# Patient Record
Sex: Male | Born: 1965 | Race: Black or African American | Hispanic: No | Smoking: Current every day smoker
Health system: Southern US, Community
[De-identification: ages and names within clinical notes are randomized; demographics above are authoritative.]

## PROBLEM LIST (undated history)

## (undated) DIAGNOSIS — F319 Bipolar disorder, unspecified: Secondary | ICD-10-CM

## (undated) DIAGNOSIS — E559 Vitamin D deficiency, unspecified: Secondary | ICD-10-CM

## (undated) DIAGNOSIS — I10 Essential (primary) hypertension: Secondary | ICD-10-CM

## (undated) DIAGNOSIS — Z9114 Patient's other noncompliance with medication regimen: Secondary | ICD-10-CM

## (undated) DIAGNOSIS — F209 Schizophrenia, unspecified: Secondary | ICD-10-CM

## (undated) DIAGNOSIS — Z8619 Personal history of other infectious and parasitic diseases: Secondary | ICD-10-CM

## (undated) HISTORY — PX: BELOW KNEE LEG AMPUTATION: SUR23

---

## 2013-05-29 LAB — COMPREHENSIVE METABOLIC PANEL
Alkaline Phosphatase: 82 U/L (ref 50–136)
Anion Gap: 5 — ABNORMAL LOW (ref 7–16)
BUN: 10 mg/dL (ref 7–18)
Bilirubin,Total: 0.5 mg/dL (ref 0.2–1.0)
Calcium, Total: 9.2 mg/dL (ref 8.5–10.1)
Co2: 28 mmol/L (ref 21–32)
EGFR (Non-African Amer.): >60
Osmolality: 279 (ref 275–301)
Potassium: 4 mmol/L (ref 3.5–5.1)
SGPT (ALT): 20 U/L (ref 12–78)

## 2013-05-29 LAB — ETHANOL
Ethanol %: <0.003 % (ref 0.000–0.080)
Ethanol: <3 mg/dL

## 2013-05-29 LAB — TSH: Thyroid Stimulating Horm: 0.37 u[IU]/mL — ABNORMAL LOW

## 2013-05-29 LAB — CBC
MCH: 27.6 pg (ref 26.0–34.0)
MCHC: 33.8 g/dL (ref 32.0–36.0)
Platelet: 238 10*3/uL (ref 150–440)
RBC: 4.95 10*6/uL (ref 4.40–5.90)
RDW: 14 % (ref 11.5–14.5)

## 2013-05-29 LAB — SALICYLATE LEVEL: Salicylates, Serum: 3.1 mg/dL — ABNORMAL HIGH

## 2013-05-30 ENCOUNTER — Inpatient Hospital Stay: Payer: Self-pay | Admitting: Psychiatry

## 2013-05-30 LAB — URINALYSIS, COMPLETE
Bacteria: NONE SEEN
Bacteria: NONE SEEN
Bilirubin,UR: NEGATIVE
Blood: NEGATIVE
Blood: NEGATIVE
Glucose,UR: NEGATIVE mg/dL (ref 0–75)
Glucose,UR: NEGATIVE mg/dL (ref 0–75)
Leukocyte Esterase: NEGATIVE
Leukocyte Esterase: NEGATIVE
Nitrite: NEGATIVE
Ph: 6 (ref 4.5–8.0)
Ph: 5 (ref 4.5–8.0)
Protein: NEGATIVE
Protein: NEGATIVE
RBC,UR: <1 /HPF (ref 0–5)
RBC,UR: 1 /HPF (ref 0–5)
Specific Gravity: 1.016 (ref 1.003–1.030)
Specific Gravity: 1.02 (ref 1.003–1.030)
Squamous Epithelial: <1
Squamous Epithelial: NONE SEEN
WBC UR: <1 /HPF (ref 0–5)
WBC UR: <1 /HPF (ref 0–5)

## 2013-05-30 LAB — DRUG SCREEN, URINE
Cannabinoid 50 Ng, Ur ~~LOC~~: NEGATIVE (ref ?–50)
Methadone, Ur Screen: NEGATIVE (ref ?–300)
Phencyclidine (PCP) Ur S: NEGATIVE (ref ?–25)
Tricyclic, Ur Screen: NEGATIVE (ref ?–1000)

## 2013-05-31 LAB — BEHAVIORAL MEDICINE 1 PANEL
Albumin: 3.2 g/dL — ABNORMAL LOW (ref 3.4–5.0)
Alkaline Phosphatase: 70 U/L (ref 50–136)
Anion Gap: 3 — ABNORMAL LOW (ref 7–16)
BUN: 8 mg/dL (ref 7–18)
Basophil #: 0.1 10*3/uL (ref 0.0–0.1)
Basophil %: 0.8 %
Bilirubin,Total: 0.3 mg/dL (ref 0.2–1.0)
Calcium, Total: 9.2 mg/dL (ref 8.5–10.1)
Chloride: 105 mmol/L (ref 98–107)
Co2: 30 mmol/L (ref 21–32)
Creatinine: 1.04 mg/dL (ref 0.60–1.30)
EGFR (African American): >60
EGFR (Non-African Amer.): >60
Eosinophil #: 0.1 10*3/uL (ref 0.0–0.7)
Eosinophil %: 1.5 %
Glucose: 95 mg/dL (ref 65–99)
HCT: 40.2 % (ref 40.0–52.0)
HGB: 13.2 g/dL (ref 13.0–18.0)
Lymphocyte #: 3.7 10*3/uL — ABNORMAL HIGH (ref 1.0–3.6)
Lymphocyte %: 49 %
MCH: 27.4 pg (ref 26.0–34.0)
MCHC: 32.9 g/dL (ref 32.0–36.0)
MCV: 84 fL (ref 80–100)
Monocyte #: 0.7 x10 3/mm (ref 0.2–1.0)
Monocyte %: 9.4 %
Neutrophil #: 2.9 10*3/uL (ref 1.4–6.5)
Neutrophil %: 39.3 %
Osmolality: 274 (ref 275–301)
Platelet: 234 10*3/uL (ref 150–440)
Potassium: 4.1 mmol/L (ref 3.5–5.1)
RBC: 4.82 10*6/uL (ref 4.40–5.90)
RDW: 14.2 % (ref 11.5–14.5)
SGOT(AST): 23 U/L (ref 15–37)
SGPT (ALT): 15 U/L (ref 12–78)
Sodium: 138 mmol/L (ref 136–145)
Thyroid Stimulating Horm: 0.214 u[IU]/mL — ABNORMAL LOW
Total Protein: 6.7 g/dL (ref 6.4–8.2)
WBC: 7.4 10*3/uL (ref 3.8–10.6)

## 2014-02-09 ENCOUNTER — Emergency Department: Payer: Self-pay | Admitting: Emergency Medicine

## 2014-02-09 LAB — URINALYSIS, COMPLETE
Bacteria: NONE SEEN
Bilirubin,UR: NEGATIVE
Blood: NEGATIVE
Glucose,UR: NEGATIVE mg/dL (ref 0–75)
KETONE: NEGATIVE
Leukocyte Esterase: NEGATIVE
NITRITE: NEGATIVE
Ph: 5 (ref 4.5–8.0)
Protein: NEGATIVE
RBC,UR: NONE SEEN /HPF (ref 0–5)
SQUAMOUS EPITHELIAL: NONE SEEN
Specific Gravity: 1.004 (ref 1.003–1.030)
WBC UR: NONE SEEN /HPF (ref 0–5)

## 2014-02-09 LAB — ETHANOL
Ethanol %: 0.254 % — ABNORMAL HIGH (ref 0.000–0.080)
Ethanol: 254 mg/dL

## 2014-02-09 LAB — ACETAMINOPHEN LEVEL: Acetaminophen: <2 ug/mL

## 2014-02-09 LAB — DRUG SCREEN, URINE
Amphetamines, Ur Screen: NEGATIVE (ref ?–1000)
Barbiturates, Ur Screen: NEGATIVE (ref ?–200)
Benzodiazepine, Ur Scrn: NEGATIVE (ref ?–200)
COCAINE METABOLITE, UR ~~LOC~~: NEGATIVE (ref ?–300)
Cannabinoid 50 Ng, Ur ~~LOC~~: NEGATIVE (ref ?–50)
MDMA (ECSTASY) UR SCREEN: NEGATIVE (ref ?–500)
Methadone, Ur Screen: NEGATIVE (ref ?–300)
Opiate, Ur Screen: NEGATIVE (ref ?–300)
PHENCYCLIDINE (PCP) UR S: NEGATIVE (ref ?–25)
TRICYCLIC, UR SCREEN: NEGATIVE (ref ?–1000)

## 2014-02-09 LAB — COMPREHENSIVE METABOLIC PANEL
Albumin: 3.9 g/dL (ref 3.4–5.0)
Alkaline Phosphatase: 80 U/L
Anion Gap: 7 (ref 7–16)
BILIRUBIN TOTAL: 0.2 mg/dL (ref 0.2–1.0)
BUN: 8 mg/dL (ref 7–18)
Calcium, Total: 9 mg/dL (ref 8.5–10.1)
Chloride: 109 mmol/L — ABNORMAL HIGH (ref 98–107)
Co2: 25 mmol/L (ref 21–32)
Creatinine: 1.08 mg/dL (ref 0.60–1.30)
Glucose: 107 mg/dL — ABNORMAL HIGH (ref 65–99)
OSMOLALITY: 280 (ref 275–301)
POTASSIUM: 3.9 mmol/L (ref 3.5–5.1)
SGOT(AST): 38 U/L — ABNORMAL HIGH (ref 15–37)
SGPT (ALT): 19 U/L (ref 12–78)
Sodium: 141 mmol/L (ref 136–145)
TOTAL PROTEIN: 8 g/dL (ref 6.4–8.2)

## 2014-02-09 LAB — CBC
HCT: 46 % (ref 40.0–52.0)
HGB: 14.8 g/dL (ref 13.0–18.0)
MCH: 28 pg (ref 26.0–34.0)
MCHC: 32.2 g/dL (ref 32.0–36.0)
MCV: 87 fL (ref 80–100)
Platelet: 255 10*3/uL (ref 150–440)
RBC: 5.29 10*6/uL (ref 4.40–5.90)
RDW: 14.6 % — AB (ref 11.5–14.5)
WBC: 8.4 10*3/uL (ref 3.8–10.6)

## 2014-02-09 LAB — SALICYLATE LEVEL: Salicylates, Serum: 2.9 mg/dL — ABNORMAL HIGH

## 2014-04-20 ENCOUNTER — Emergency Department: Payer: Self-pay | Admitting: Emergency Medicine

## 2014-04-20 LAB — COMPREHENSIVE METABOLIC PANEL
ALK PHOS: 70 U/L
Albumin: 3.5 g/dL (ref 3.4–5.0)
Anion Gap: 5 — ABNORMAL LOW (ref 7–16)
BUN: 6 mg/dL — ABNORMAL LOW (ref 7–18)
Bilirubin,Total: 0.3 mg/dL (ref 0.2–1.0)
CO2: 26 mmol/L (ref 21–32)
Calcium, Total: 8.4 mg/dL — ABNORMAL LOW (ref 8.5–10.1)
Chloride: 111 mmol/L — ABNORMAL HIGH (ref 98–107)
Creatinine: 1 mg/dL (ref 0.60–1.30)
EGFR (African American): >60
EGFR (Non-African Amer.): >60
Glucose: 83 mg/dL (ref 65–99)
Osmolality: 280 (ref 275–301)
Potassium: 4 mmol/L (ref 3.5–5.1)
SGOT(AST): 28 U/L (ref 15–37)
SGPT (ALT): 25 U/L
Sodium: 142 mmol/L (ref 136–145)
Total Protein: 7.5 g/dL (ref 6.4–8.2)

## 2014-04-20 LAB — CBC WITH DIFFERENTIAL/PLATELET
Basophil #: 0 10*3/uL (ref 0.0–0.1)
Basophil %: 0.2 %
EOS ABS: 0.1 10*3/uL (ref 0.0–0.7)
Eosinophil %: 0.7 %
HCT: 42 % (ref 40.0–52.0)
HGB: 13.4 g/dL (ref 13.0–18.0)
LYMPHS ABS: 4.5 10*3/uL — AB (ref 1.0–3.6)
Lymphocyte %: 45.4 %
MCH: 27.4 pg (ref 26.0–34.0)
MCHC: 31.9 g/dL — AB (ref 32.0–36.0)
MCV: 86 fL (ref 80–100)
MONO ABS: 0.9 x10 3/mm (ref 0.2–1.0)
MONOS PCT: 8.6 %
Neutrophil #: 4.5 10*3/uL (ref 1.4–6.5)
Neutrophil %: 45.1 %
Platelet: 233 10*3/uL (ref 150–440)
RBC: 4.88 10*6/uL (ref 4.40–5.90)
RDW: 13.5 % (ref 11.5–14.5)
WBC: 9.9 10*3/uL (ref 3.8–10.6)

## 2014-04-20 LAB — TSH: THYROID STIMULATING HORM: 0.569 u[IU]/mL

## 2014-04-20 LAB — SALICYLATE LEVEL: SALICYLATES, SERUM: 3.6 mg/dL — AB

## 2014-04-20 LAB — TROPONIN I

## 2014-04-20 LAB — ETHANOL
ETHANOL %: 0.161 % — AB (ref 0.000–0.080)
ETHANOL LVL: 161 mg/dL

## 2014-04-20 LAB — ACETAMINOPHEN LEVEL

## 2014-04-21 LAB — DRUG SCREEN, URINE
Amphetamines, Ur Screen: NEGATIVE (ref ?–1000)
BARBITURATES, UR SCREEN: NEGATIVE (ref ?–200)
BENZODIAZEPINE, UR SCRN: NEGATIVE (ref ?–200)
COCAINE METABOLITE, UR ~~LOC~~: POSITIVE (ref ?–300)
Cannabinoid 50 Ng, Ur ~~LOC~~: NEGATIVE (ref ?–50)
MDMA (Ecstasy)Ur Screen: NEGATIVE (ref ?–500)
Methadone, Ur Screen: NEGATIVE (ref ?–300)
OPIATE, UR SCREEN: NEGATIVE (ref ?–300)
PHENCYCLIDINE (PCP) UR S: NEGATIVE (ref ?–25)
Tricyclic, Ur Screen: NEGATIVE (ref ?–1000)

## 2014-04-29 ENCOUNTER — Inpatient Hospital Stay: Payer: Self-pay | Admitting: Psychiatry

## 2014-04-29 LAB — URINALYSIS, COMPLETE
BILIRUBIN, UR: NEGATIVE
BLOOD: NEGATIVE
Bacteria: NONE SEEN
Glucose,UR: NEGATIVE mg/dL (ref 0–75)
KETONE: NEGATIVE
LEUKOCYTE ESTERASE: NEGATIVE
NITRITE: NEGATIVE
Ph: 5 (ref 4.5–8.0)
Protein: NEGATIVE
RBC,UR: 1 /HPF (ref 0–5)
Specific Gravity: 1.019 (ref 1.003–1.030)
WBC UR: 1 /HPF (ref 0–5)

## 2014-04-29 LAB — COMPREHENSIVE METABOLIC PANEL
ALBUMIN: 3.5 g/dL (ref 3.4–5.0)
ALK PHOS: 77 U/L
Anion Gap: 10 (ref 7–16)
BUN: 6 mg/dL — ABNORMAL LOW (ref 7–18)
Bilirubin,Total: 0.2 mg/dL (ref 0.2–1.0)
CO2: 25 mmol/L (ref 21–32)
Calcium, Total: 8.9 mg/dL (ref 8.5–10.1)
Chloride: 107 mmol/L (ref 98–107)
Creatinine: 0.98 mg/dL (ref 0.60–1.30)
EGFR (African American): >60
Glucose: 92 mg/dL (ref 65–99)
OSMOLALITY: 280 (ref 275–301)
Potassium: 3.7 mmol/L (ref 3.5–5.1)
SGOT(AST): 33 U/L (ref 15–37)
SGPT (ALT): 22 U/L
Sodium: 142 mmol/L (ref 136–145)
Total Protein: 7.8 g/dL (ref 6.4–8.2)

## 2014-04-29 LAB — DRUG SCREEN, URINE
AMPHETAMINES, UR SCREEN: NEGATIVE (ref ?–1000)
BARBITURATES, UR SCREEN: NEGATIVE (ref ?–200)
Benzodiazepine, Ur Scrn: NEGATIVE (ref ?–200)
Cannabinoid 50 Ng, Ur ~~LOC~~: NEGATIVE (ref ?–50)
Cocaine Metabolite,Ur ~~LOC~~: POSITIVE (ref ?–300)
MDMA (Ecstasy)Ur Screen: NEGATIVE (ref ?–500)
Methadone, Ur Screen: NEGATIVE (ref ?–300)
Opiate, Ur Screen: NEGATIVE (ref ?–300)
Phencyclidine (PCP) Ur S: NEGATIVE (ref ?–25)
Tricyclic, Ur Screen: NEGATIVE (ref ?–1000)

## 2014-04-29 LAB — CBC
HCT: 43.8 % (ref 40.0–52.0)
HGB: 14.3 g/dL (ref 13.0–18.0)
MCH: 27.8 pg (ref 26.0–34.0)
MCHC: 32.6 g/dL (ref 32.0–36.0)
MCV: 85 fL (ref 80–100)
PLATELETS: 215 10*3/uL (ref 150–440)
RBC: 5.14 10*6/uL (ref 4.40–5.90)
RDW: 14 % (ref 11.5–14.5)
WBC: 9.5 10*3/uL (ref 3.8–10.6)

## 2014-04-29 LAB — SALICYLATE LEVEL: Salicylates, Serum: 4.1 mg/dL — ABNORMAL HIGH

## 2014-04-29 LAB — ETHANOL
ETHANOL LVL: 31 mg/dL
Ethanol %: 0.031 % (ref 0.000–0.080)

## 2014-04-29 LAB — ACETAMINOPHEN LEVEL

## 2015-01-11 NOTE — H&P (Signed)
PATIENT NAME:  Sean Hoffman, Sean Hoffman MR#:  [id number] DATE OF BIRTH:  1966-06-29  DATE OF ADMISSION:  05/30/2013  DATE OF ASSESSMENT: 05/31/2013    REFERRING PHYSICIAN: Dr. York Cerise.  ATTENDING PHYSICIAN: Jolanta B. Jennet Maduro, M.D.   IDENTIFYING DATA: Sean Hoffman is a 49 year old man with history of depression and cocaine addiction.   CHIEF COMPLAINT: "I'm a failure."   HISTORY OF PRESENT ILLNESS: Sean Hoffman has been a patient of Dr. Marguerite Olea. He has been compliant with his medications of Celexa. He says that the medication was working very well for him. Two weeks ago, his girlfriend was admitted to the hospital for an ulcer. It is hard to understand what happened, but the patient felt overwhelmed, stopped taking his Celexa and relapsed on cocaine. He has been 8 years sober following treatment at the [facility] rehab facility. He tells me that he is not really glad to be alive. He feels awful and has a feeling that he cannot accomplish anything in his life; that everybody else would be better off if he were dead. He reports poor sleep, decreased appetite, anhedonia, feeling of guilt, hopelessness,  worthlessness, poor energy and concentration, social isolation, crying spells that culminated in a suicide attempt by overdose of 17 40 mg tablets of Celexa. He also noticed auditory hallucinations with voices commanding him to hurt himself. He reports no psychotic symptoms in the past except for periods of substance abuse. He denies symptoms suggestive of bipolar mania. There are no other than cocaine substances involved.   PAST PSYCHIATRIC HISTORY: He had a suicide attempt by hanging in 2000. At that time, he was going through a divorce with his wife. He was hospitalized in [facility]. He later entered [facility] treatment facility where he spent 30 days in rehab and about 10 months in their substance abuse treatment program. He was clean of cocaine up until now.   FAMILY PSYCHIATRIC HISTORY: None  reported.   PAST MEDICAL HISTORY: None.   ALLERGIES: No known drug allergies.   MEDICATIONS ON ADMISSION: Celexa 20 mg daily.   SOCIAL HISTORY: He is originally from [place], [place]. Graduated from high school and had some college. He used to work at [employer] up until 2 years ago where he started his disability. He reports that disability is for mental illness. He is divorced and has 2 children. His daughter is in college. His 36 year old son lives in the area and he can see him as much as he wants. They have a good relationship. He has a remote history of DUI and charges of possession of stolen  merchandise. There are no legal charges pending at this point. His chart says that he is homeless now, but he tells me that he stays with his girlfriend and they rent a house.   REVIEW OF SYSTEMS: CONSTITUTIONAL: No fevers or chills. No weight changes.  EYES: No double or blurred vision.  EARS, NOSE, THROAT: No hearing loss.  RESPIRATORY: No shortness of breath or cough.  CARDIOVASCULAR: No chest pain or orthopnea.  GASTROINTESTINAL: No abdominal pain, nausea, vomiting or diarrhea.  GENITOURINARY: No incontinence or frequency.  ENDOCRINE: No heat or cold intolerance.  LYMPHATIC: No anemia or easy bruising.  INTEGUMENTARY: No acne or rash.  MUSCULOSKELETAL: No muscle or joint pain.  NEUROLOGIC: No tingling or weakness.  PSYCHIATRIC: See history of present illness for details.   PHYSICAL EXAMINATION: VITAL SIGNS: Blood pressure 116/82, pulse 63, respirations 18, temperature 97.7.  GENERAL: This is a well-developed male in no acute  distress.  HEENT: The pupils are equal, round and reactive to light. Sclerae are anicteric.  NECK: Supple. No thyromegaly.  LUNGS: Clear to auscultation. No dullness to percussion.  HEART: Regular rhythm and rate. No murmurs, rubs or gallops.  ABDOMEN: Soft, nontender, nondistended. Positive bowel sounds.  MUSCULOSKELETAL: Normal muscle strength in all extremities.   SKIN: No rashes or bruises.  LYMPHATIC: No cervical adenopathy.  NEUROLOGIC: Cranial nerves II through XII are intact.   LABORATORY DATA: Chemistries are within normal limits. Blood alcohol level is 0. LFTs within normal limits. TSH 0.37. Urine tox screen is positive for cocaine. CBC within normal limits. Urinalysis is not suggestive of urinary tract infection. Serum acetaminophen less than 2, serum salicylates 3.1.   MENTAL STATUS EXAMINATION ON ADMISSION: The patient is alert and oriented to person, place, time and situation. He is pleasant, polite and cooperative. He is in bed, wearing hospital scrubs. There is psychomotor retardation. He maintains limited eye contact. His speech is soft. Mood is depressed with flat affect. Thought process is logical and goal oriented. Thought content: He denies suicidal or homicidal ideation, but is quite unhappy to still be alive after a suicide attempt. He is able to contract for safety in the hospital. There are no delusions or paranoia. He endorses auditory command hallucinations telling him to kill himself. His cognition is grossly intact. He registers 3/3 recalls 3/3 objects after 5 minutes. He can spell "world" forwards and backwards. He knows the current president. His insight and judgment are questionable.   SUICIDE RISK ASSESSMENT ON ADMISSION: This is a patient with history of depression who attempted suicide in the context of relationship problems and relapse on cocaine. He is at increased risk of suicide.   INITIAL DIAGNOSES:  AXIS I: Major depressive disorder, recurrent, severe. Cocaine dependence.  AXIS II: Deferred.  AXIS III: Deferred.  AXIS IV: Mental illness, substance abuse, relationship, housing, primary support.  AXIS V: Global Assessment of Functioning on admission 25.   PLAN: The patient was admitted to [facility] behavioral medicine unit for safety, stabilization and medication management. He was initially placed  on suicide precautions and was closely monitored for any unsafe behaviors. He underwent full psychiatric and risk assessment. He will receive pharmacotherapy, individual and group psychotherapy, substance abuse counseling and support from therapeutic milieu.  1.  Suicidal ideation: This has resolved, but the patient is still passively suicidal.  2.  Mood: He was started on Celexa by admitting psychiatrist in the Emergency Room. We will unlikely add an antipsychotic to address hallucinations.  3.  Substance abuse: The patient would very much like to go back to [facility] program. A social worker will evaluate.   DISPOSITION: To be established.     ____________________________ Ellin GoodieJolanta B. Pucilowska, MD jbp:dmm D: 05/31/2013 11:46:00 ET T: 05/31/2013 12:55:41 ET JOB#: [id number]  cc: Jolanta B. Jennet MaduroPucilowska, MD, <Dictator> Shari ProwsJOLANTA B PUCILOWSKA MD ELECTRONICALLY SIGNED 06/05/2013 22:20

## 2015-01-11 NOTE — Consult Note (Signed)
PATIENT NAME:  Sean Hoffman, Amadou MR#:  [id number] DATE OF BIRTH:  02-24-1966  DATE OF CONSULTATION:  05/29/2013  CONSULTING PHYSICIAN: Rhunette CroftUzma S Bodee Lafoe, M.D., M.D.   REQUESTING PHYSICIAN: Dr. York CeriseForbach.  REASON FOR CONSULTATION: " I'm a failure."   HISTORY OF PRESENT ILLNESS: The patient is a 49 year old African American male who presented to the ED after he had taken an overdose of 17 Celexa 40 mg tablets. According to the initial notes, the patient reported that he is a failure and life is not worth living. He reported that he took all the pills which were left in the bottle. He stated that he has nobody and he feels alone and he feels very depressed. The patient reported that the Celexa were prescribed by Dr. Marguerite OleaMoffett, and he followed with Dr. Marguerite OleaMoffett last month.   During my interview, the patient appeared very depressed and he reported that he stopped taking the medications approximately 2 weeks ago. He felt that he was going downhill. He feels hopeless, helpless, as he does not have any friends and family. He reported that he also stopped seeing his girlfriend 2 days ago as he does not care about her anymore. He does not want to take the medications. The patient reported that he started using cocaine and spent approximately $300 on it recently. The patient reported that he was borrowing alcohol from the friends. His last use was on Friday. He reported that he relapsed on the drugs and it was bad and he was feeling very depressed and hopeless. He decided to end it all and then took remaining pills of Celexa. He was brought to the hospital by the EMS as he was feeling suicidal. He stated that he wants help at this time.   PAST PSYCHIATRIC HISTORY: The patient reported that he was admitted to a psychiatric hospital in [place] after he tried to hang himself. He has also tried trazodone, Wellbutrin in the past. He reported that occasionally he has command auditory hallucinations while he is intoxicated, who are  asking him to hurt himself. However, he has not heard any such voices in a long time.   FAMILY HISTORY: The patient denies any history of psychiatric illness in his family. He denies any history of suicide attempts in the family members as well.  MEDICAL HISTORY: The patient reported that he does not have any medical issues at this time.   ALLERGIES: No known drug allergies.   SOCIAL HISTORY: The patient was married once in the past. He has two children, ages 6023 and 49 years old. They currently live with their mother. He reported that he receives a check from [employer]. He was last employed 2 years ago when he working for [employer]. He reported that he has history of DWI in 2007 and was also charged with possession of stolen merchandise in 2006. He does not have any pending legal charges at this time. He reported that he does not have a place to stay at this time.   CURRENT MEDICATIONS:  Citalopram 20 mg daily prescribed by Dr. Marguerite OleaMoffett. He is compliant with his medications, but has stopped taking them for the past two weeks.   PHYSICAL EXAMINATION:  VITAL SIGNS: Temperature 98.4, pulse 68, respirations 18, blood pressure 158/85, Glucose 112, BUN 10, creatinine 0.95, sodium 140, potassium 4.0, chloride 107, bicarbonate 28, anion gap 5, osmolality 279, calcium 9.2, blood alcohol level less than 3. Protein 7.0, albumin 3.4, bilirubin 0.5, alkaline phosphatase 82, AST 29, ALT 20. TSH 0.37. WBC 8.3,  RBC 4.95, hemoglobin 13.7, hematocrit 40.4, MCV 82.   REVIEW OF SYSTEMS:  CONSTITUTIONAL: Denies any fever or chills. No weight changes.  EYES: No double or blurred vision.  RESPIRATORY: No shortness of breath or cough.  CARDIOVASCULAR: Denies any chest pain or orthopnea.  GASTROINTESTINAL: No abdominal pain, nausea, vomiting or diarrhea.  GENITOURINARY: No incontinence or frequency.  ENDOCRINE: No heat or cold intolerance.  LYMPHATIC: No anemia or easy bruising.  INTEGUMENTARY: No acne or rash.   MUSCULOSKELETAL: No muscle or joint pain.  NEUROLOGIC: No tingling or weakness.   MENTAL STATUS EXAMINATION: The patient is a moderately built male who appeared his stated age. He was calm and cooperative. He was lying in the bed. His speech was low in tone and volume. Mood was depressed and anxious. Affect was congruent. Thought process was logical, goal-directed. Thought content was non delusional. He was unable to contract for safety and recently attempted suicide by overdosing on the pills. He denied having any perceptual disturbances.   DIAGNOSTIC IMPRESSION: AXIS I:  1. Major depressive disorder, recurrent, severe, without psychotic features.  2. Alcohol dependence.  3. Cocaine abuse.  AXIS II: None.  AXIS III: None.   TREATMENT PLAN: 1. The patient is currently under involuntary commitment and will be admitted to the behavioral health unit for stabilization and safety.  2.  He will be continued on Celexa 20  mg p.o. daily.  3. He will also be given trazodone 100 mg p.o. at bedtime for insomnia. The patient will be monitored by the treatment team and they will adjust his medications according to his needs.   Thank you for allowing me to participate in the care of this patient.   ____________________________ Ardeen Fillers. Garnetta Buddy, MD usf:sg D: 05/30/2013 11:56:00 ET T: 05/30/2013 12:55:30 ET JOB#: [id number]  cc: Ardeen Fillers. Garnetta Buddy, MD, <Dictator> Rhunette Croft MD ELECTRONICALLY SIGNED 06/01/2013 13:17

## 2015-01-12 NOTE — H&P (Signed)
PATIENT NAME:  Sean Hoffman, LONGSHORE MR#:  [id number] DATE OF BIRTH:  03-18-1966  DATE OF ADMISSION:  04/29/2014  REFERRING PHYSICIAN: Emergency Room MD.   ATTENDING PHYSICIAN: Kristine Linea, MD   IDENTIFYING DATA: [name] is a 49 year old male with history of depression and substance abuse.   CHIEF COMPLAINT: "I went on a binge."   HISTORY OF PRESENT ILLNESS: [name] came to the Emergency Room suicidal and very depressed. He relapsed on alcohol and cocaine 3 weeks ago and has been drinking continuously since. He feels in bad, guilty, worthless, hopeless, and decided to kill life with overdose of pills. Somehow, his family became aware and they encouraged him to come to the hospital. The patient has a long history of depression, drinking, and drugging. He has had several psychiatric hospitalizations for depression and also for substance abuse. He has been following up with Dr. Carman Ching at [facility] and when not drinking he is taking Celexa and trazodone. However, he stopped all his medications 3 weeks ago. He reports poor sleep, decreased appetite, anhedonia, feeling of guilt, hopelessness, worthlessness, poor memory and concentration, crying spells, social isolation that culminated in suicidal thoughts and plan. He denies psychotic symptoms, denies symptoms suggestive of bipolar mania. There is no severe anxiety. He had admits to heavy drinking, cannot specify how much, and using cocaine and marijuana.   PAST PSYCHIATRIC HISTORY: He has been hospitalized several times for substance abuse and for depression. He was at [facility] in September 2014. From here, he went to [facility] and was able to maintain sobriety for 4 months. He has one prior admission to [facility] when he stayed for a month and 10 more months in the substance abuse long-term treatment program. He was clean, off substances for over 10 years.   FAMILY PSYCHIATRIC HISTORY: None reported.   PAST MEDICAL  HISTORY: None.   ALLERGIES: No known drug allergies.   MEDICATIONS ON ADMISSION: Celexa 30 mg daily, trazodone 150 mg at bedtime.   SOCIAL HISTORY: He is originally from the [place]. He graduated from high school and went to college some. He worked at the [employer], but is disabled now. Disability is reportedly for mental illness. He is divorced. He has 2 children in the area with whom he stays in touch. There are no legal charges at this point. He lives independently. His sister lives in the area and has been very supportive. He was in a relationship, but tells me that now he feels that substance abuse treatment is more important, so the relationship is on hold.   REVIEW OF SYSTEMS: CONSTITUTIONAL: No fevers or chills. No weight changes.  EYES: No double or blurred vision.  EARS, NOSE, AND THROAT: No hearing loss.  RESPIRATORY: No shortness of breath or cough.  CARDIOVASCULAR: No chest pain or orthopnea.  GASTROINTESTINAL: No abdominal pain, nausea, vomiting, or diarrhea.  GENITOURINARY: No incontinence or frequency.  ENDOCRINE: No heat or cold intolerance.  LYMPHATIC: No anemia or easy bruising.  INTEGUMENTARY: No acne or rash.  MUSCULOSKELETAL: No muscle or joint pain.  NEUROLOGIC: No tingling or weakness.  PSYCHIATRIC: See history of present illness for details.   PHYSICAL EXAMINATION:  VITAL SIGNS: Blood pressure 121/88, pulse 66, respirations 20, temperature 98.6.  GENERAL: This is a well-developed, middle-aged gentleman in no acute distress.  HEENT: The pupils are equal, round, and reactive to light. Sclerae are anicteric.  NECK: Supple. No thyromegaly.  LUNGS: Clear to auscultation. No dullness to percussion.  HEART:  Regular rhythm and rate. No murmurs, rubs, or gallops.  ABDOMEN: Soft, nontender, nondistended. Positive bowel sounds.  MUSCULOSKELETAL: Normal muscle strength in all extremities.  SKIN: No rashes or bruises.  LYMPHATIC: No cervical adenopathy.  NEUROLOGIC:  Cranial nerves II-XII are intact.   LABORATORY DATA: Chemistries are within normal limits. Blood alcohol level is 0.031. LFTs within normal limits. Urine toxicology screen is positive for cocaine. CBC within normal limits. Urinalysis is not suggestive of urinary tract infection. Serum acetaminophen and salicylates are low.   MENTAL STATUS EXAMINATION ON ADMISSION: The patient is alert and oriented to person, place, time, and situation. He is pleasant, polite, and cooperative. He recognizes me from previous admission. He maintains good eye contact. His speech is of normal rhythm, rate, and volume. Mood is depressed with flat affect. Thought process is logical and goal oriented. Thought content: He denies suicidal or homicidal ideation, but tried to attempt suicide by medication overdose prior to admission. There are no delusions or paranoia. There are no auditory or visual hallucinations. His cognition is grossly intact. Registration, recall, short and long-term memory are intact. He is of average intelligence and fund of knowledge. His insight and judgment are limited.   SUICIDE RISK ASSESSMENT ON ADMISSION: This is a patient with a long history of mental illness and disability from mental illness. He is in great need of substance abuse treatment and is willing to try intensive outpatient program.   INITIAL DIAGNOSES: AXIS I: Major depressive disorder, recurrent, severe; alcohol dependence; cocaine dependence.  AXIS II: Deferred.  AXIS III: Deferred.  AXIS IV: Mental illness; substance abuse. AXIS V: Global assessment of functions on admission 25.   PLAN: The patient was admitted to [facility] Behavioral Medicine Unit for safety, stabilization, and medication management. He was initially placed on suicide precautions and was closely monitored for any unsafe behaviors. He underwent full psychiatric and risk assessment. He received pharmacotherapy, individual and group  psychotherapy, substance abuse counseling, and support from therapeutic milieu.  1.  Suicidal ideation: The patient is able to contract for safety.  2.  Alcohol detoxification: He is placed on the CIWA protocol. We will monitor for symptoms of alcohol withdrawal. 3.  Depression. We started Celexa and trazodone.  4.  Substance abuse treatment: He opted for outpatient treatment.   DISPOSITION: He will be discharged to home. Follow up with [facility].    ___________________________ Ellin GoodieJolanta B. Jennet MaduroPucilowska, MD jbp:tm D: 04/30/2014 13:54:10 ET T: 04/30/2014 14:38:52 ET JOB#: [id number]  cc: Jolanta B. Jennet MaduroPucilowska, MD, <Dictator> Shari ProwsJOLANTA B PUCILOWSKA MD ELECTRONICALLY SIGNED 05/05/2014 2:32

## 2016-08-04 ENCOUNTER — Emergency Department
Admission: EM | Admit: 2016-08-04 | Discharge: 2016-08-04 | Disposition: A | Discharge status: Other Acute Inpatient Hospital | Payer: Medicaid Other | Attending: Emergency Medicine | Admitting: Emergency Medicine

## 2016-08-04 ENCOUNTER — Inpatient Hospital Stay
Admission: EM | Admit: 2016-08-04 | Discharge: 2016-08-06 | DRG: 885 | Disposition: A | Discharge status: Home / Self Care | Payer: Medicare Other | Source: Intra-hospital | Attending: Psychiatry | Admitting: Psychiatry

## 2016-08-04 DIAGNOSIS — Z5181 Encounter for therapeutic drug level monitoring: Secondary | ICD-10-CM | POA: Insufficient documentation

## 2016-08-04 DIAGNOSIS — F17200 Nicotine dependence, unspecified, uncomplicated: Secondary | ICD-10-CM

## 2016-08-04 DIAGNOSIS — F332 Major depressive disorder, recurrent severe without psychotic features: Secondary | ICD-10-CM

## 2016-08-04 DIAGNOSIS — I10 Essential (primary) hypertension: Secondary | ICD-10-CM | POA: Insufficient documentation

## 2016-08-04 DIAGNOSIS — F1020 Alcohol dependence, uncomplicated: Secondary | ICD-10-CM

## 2016-08-04 DIAGNOSIS — F17210 Nicotine dependence, cigarettes, uncomplicated: Secondary | ICD-10-CM | POA: Insufficient documentation

## 2016-08-04 DIAGNOSIS — Z89611 Acquired absence of right leg above knee: Secondary | ICD-10-CM

## 2016-08-04 DIAGNOSIS — G4700 Insomnia, unspecified: Secondary | ICD-10-CM | POA: Diagnosis present

## 2016-08-04 DIAGNOSIS — Y907 Blood alcohol level of 200-239 mg/100 ml: Secondary | ICD-10-CM | POA: Diagnosis present

## 2016-08-04 DIAGNOSIS — R45851 Suicidal ideations: Secondary | ICD-10-CM | POA: Diagnosis present

## 2016-08-04 DIAGNOSIS — F1420 Cocaine dependence, uncomplicated: Secondary | ICD-10-CM | POA: Diagnosis present

## 2016-08-04 LAB — URINE DRUG SCREEN, QUALITATIVE (ARMC ONLY)
AMPHETAMINES, UR SCREEN: NOT DETECTED
BENZODIAZEPINE, UR SCRN: NOT DETECTED
Barbiturates, Ur Screen: NOT DETECTED
COCAINE METABOLITE, UR ~~LOC~~: POSITIVE — AB
Cannabinoid 50 Ng, Ur ~~LOC~~: NOT DETECTED
MDMA (ECSTASY) UR SCREEN: NOT DETECTED
METHADONE SCREEN, URINE: NOT DETECTED
OPIATE, UR SCREEN: NOT DETECTED
Phencyclidine (PCP) Ur S: NOT DETECTED
Tricyclic, Ur Screen: NOT DETECTED

## 2016-08-04 LAB — CBC
HEMATOCRIT: 45.8 % (ref 40.0–52.0)
Hemoglobin: 15.1 g/dL (ref 13.0–18.0)
MCH: 27.4 pg (ref 26.0–34.0)
MCHC: 33.1 g/dL (ref 32.0–36.0)
MCV: 83 fL (ref 80.0–100.0)
Platelets: 279 10*3/uL (ref 150–440)
RBC: 5.52 MIL/uL (ref 4.40–5.90)
RDW: 15.2 % — ABNORMAL HIGH (ref 11.5–14.5)
WBC: 10.8 10*3/uL — AB (ref 3.8–10.6)

## 2016-08-04 LAB — BASIC METABOLIC PANEL
ANION GAP: 9 (ref 5–15)
BUN: 8 mg/dL (ref 6–20)
CHLORIDE: 109 mmol/L (ref 101–111)
CO2: 25 mmol/L (ref 22–32)
Calcium: 9.1 mg/dL (ref 8.9–10.3)
Creatinine, Ser: 0.92 mg/dL (ref 0.61–1.24)
GFR calc Af Amer: >60 mL/min (ref >60)
GFR calc non Af Amer: >60 mL/min (ref >60)
GLUCOSE: 97 mg/dL (ref 65–99)
POTASSIUM: 3.9 mmol/L (ref 3.5–5.1)
Sodium: 143 mmol/L (ref 135–145)

## 2016-08-04 LAB — ETHANOL: Alcohol, Ethyl (B): 210 mg/dL — ABNORMAL HIGH (ref <5)

## 2016-08-04 MED ORDER — HALOPERIDOL 2 MG PO TABS
2.0000 mg | ORAL_TABLET | Freq: Once | ORAL | Status: AC
  Administered 2016-08-04: 2 mg via ORAL
  Filled 2016-08-04: qty 1

## 2016-08-04 MED ORDER — CITALOPRAM HYDROBROMIDE 20 MG PO TABS
20.0000 mg | ORAL_TABLET | Freq: Every day | ORAL | Status: DC
  Administered 2016-08-04: 20 mg via ORAL
  Filled 2016-08-04: qty 1

## 2016-08-04 MED ORDER — NICOTINE 21 MG/24HR TD PT24
21.0000 mg | MEDICATED_PATCH | Freq: Every day | TRANSDERMAL | Status: DC
  Administered 2016-08-05 – 2016-08-06 (×2): 21 mg via TRANSDERMAL
  Filled 2016-08-04 (×2): qty 1

## 2016-08-04 MED ORDER — PNEUMOCOCCAL VAC POLYVALENT 25 MCG/0.5ML IJ INJ: 0.5000 mL | INJECTION | INTRAMUSCULAR | Status: DC

## 2016-08-04 MED ORDER — TRAZODONE HCL 100 MG PO TABS
100.0000 mg | ORAL_TABLET | Freq: Every evening | ORAL | Status: DC | PRN
  Administered 2016-08-05: 100 mg via ORAL
  Filled 2016-08-04: qty 1

## 2016-08-04 MED ORDER — LORAZEPAM 2 MG/ML IJ SOLN: 0.0000 mg | Freq: Four times a day (QID) | INTRAMUSCULAR | Status: DC

## 2016-08-04 MED ORDER — ALUM & MAG HYDROXIDE-SIMETH 200-200-20 MG/5ML PO SUSP: 30.0000 mL | ORAL | Status: DC | PRN

## 2016-08-04 MED ORDER — LORAZEPAM 2 MG/ML IJ SOLN: 0.0000 mg | Freq: Two times a day (BID) | INTRAMUSCULAR | Status: DC

## 2016-08-04 MED ORDER — INFLUENZA VAC SPLIT QUAD 0.5 ML IM SUSY: 0.5000 mL | PREFILLED_SYRINGE | INTRAMUSCULAR | Status: DC

## 2016-08-04 MED ORDER — LORAZEPAM 1 MG PO TABS
1.0000 mg | ORAL_TABLET | Freq: Once | ORAL | Status: AC
  Administered 2016-08-04: 1 mg via ORAL
  Filled 2016-08-04: qty 1

## 2016-08-04 MED ORDER — CITALOPRAM HYDROBROMIDE 20 MG PO TABS
20.0000 mg | ORAL_TABLET | Freq: Every day | ORAL | Status: DC
  Administered 2016-08-05 – 2016-08-06 (×2): 20 mg via ORAL
  Filled 2016-08-04 (×2): qty 1

## 2016-08-04 MED ORDER — PHENYLEPH-SHARK LIV OIL-MO-PET 0.25-3-14-71.9 % RE OINT
TOPICAL_OINTMENT | Freq: Two times a day (BID) | RECTAL | Status: DC | PRN
  Administered 2016-08-05: via RECTAL
  Filled 2016-08-04 (×2): qty 28.4

## 2016-08-04 MED ORDER — ACETAMINOPHEN 325 MG PO TABS: 650.0000 mg | ORAL_TABLET | Freq: Four times a day (QID) | ORAL | Status: DC | PRN

## 2016-08-04 MED ORDER — MAGNESIUM HYDROXIDE 400 MG/5ML PO SUSP: 30.0000 mL | Freq: Every day | ORAL | Status: DC | PRN

## 2016-08-04 NOTE — ED Triage Notes (Addendum)
Pt arrives via [facility] with reports of not wanting to live anymore  Pt presents a danger to himself  IVC

## 2016-08-04 NOTE — ED Notes (Signed)
BEHAVIORAL HEALTH ROUNDING Patient sleeping: Yes.   Patient alert and oriented: eyes closed  Appears to be asleep Behavior appropriate: Yes.  ; If no, describe:  Nutrition and fluids offered: sleeping Toileting and hygiene offered: sleeping Sitter present: q 15 minute observations and security monitoring Law enforcement present: yes  ODS 
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
## 2016-08-04 NOTE — Tx Team (Signed)
Initial Treatment Plan 08/04/2016 11:02 PM Sean Hoffman ZOX:[id number]    PATIENT STRESSORS: Medication change or noncompliance Substance abuse Other: "Living arrangements"   PATIENT STRENGTHS: Active sense of humor Capable of independent living General fund of knowledge   PATIENT IDENTIFIED PROBLEMS: Depression  Suicidal Ideation "I was hoping to go to sleep and not wake up."  Substance abuse "If I drink I'm just gonna drink till I pass out."    "Depression. Trying to get it under control."  "Getting my meds back so I can get myself back to normal."           DISCHARGE CRITERIA:  Improved stabilization in mood, thinking, and/or behavior Motivation to continue treatment in a less acute level of care Need for constant or close observation no longer present Reduction of life-threatening or endangering symptoms to within safe limits Verbal commitment to aftercare and medication compliance  PRELIMINARY DISCHARGE PLAN: Attend PHP/IOP Attend 12-step recovery group Outpatient therapy  PATIENT/FAMILY INVOLVEMENT: This treatment plan has been presented to and reviewed with the patient, Sean Hoffman, and/or family member.  The patient and family have been given the opportunity to ask questions and make suggestions.  [name], RN 08/04/2016, 11:02 PM

## 2016-08-04 NOTE — ED Notes (Signed)
Dr.clapaas in room with patient. 

## 2016-08-04 NOTE — ED Notes (Signed)

## 2016-08-04 NOTE — ED Notes (Signed)
BEHAVIORAL HEALTH ROUNDING Patient sleeping: No. Patient alert and oriented: yes Behavior appropriate: Yes.  ; If no, describe:  Nutrition and fluids offered: yes Toileting and hygiene offered: Yes  Sitter present: q15 minute observations and security  monitoring Law enforcement present: Yes  ODS  

## 2016-08-04 NOTE — Consult Note (Signed)
[facility] Consult   Reason for Consult:  Consult for 50 year old man who presented to the emergency room with suicidal ideation and substance abuse Referring Physician:  Quale Patient Identification: Sean Hoffman MRN:  [id number] Principal Diagnosis: Severe recurrent major depression without psychotic features [facility]) Diagnosis:   Patient Active Problem List   Diagnosis Date Noted  . Alcohol abuse [F10.10] 08/04/2016  . Cocaine abuse [F14.10] 08/04/2016  . Severe recurrent major depression without psychotic features ([place]) [F33.2] 08/04/2016  . Substance induced mood disorder ([place]) [F19.94] 08/04/2016  . Suicidal ideation [R45.851] 08/04/2016    Total Time spent with patient: 1 hour  Subjective:   Sean Hoffman is a 50 y.o. male patient admitted with "I need help".  HPI:  Patient interviewed. Chart reviewed. Labs and vitals reviewed. 50 year old man presented to the emergency room stating that he felt like killing himself. He says it's been a bad few days. His mood feels very sad and down. It's the anniversary of his mother's death. On top of that he has been drinking a lot and using drugs. Patient came in to the emergency room saying that he thought he had seizures in the last day although the description is a little vague. He says he's had suicidal thoughts without a specific plan. Poor sleep. Poor appetite poor self-care not currently getting any outpatient psychiatric treatment.  Social history: Patient says he has a place to live but it was hard to get any more details out of him. He is very tired and is not a very good historian.  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 No acute complaint from it. No other active medical problems as he is not on any medication.  Substance abuse history: Extensive history of alcohol and drug abuse. Has been to the state program in the past and has had months of  sobriety. Patient says he has had seizures recently. He says he has had hospitalizations for alcohol in the past. It's unclear whether he's had full delirium tremens but he does not really describe it.  Past Psychiatric History: Patient has had previous hospitalizations here. Last time about 3 years ago from what I can tell. Was treated with antidepressants and was treated for substance abuse with detox and referral to outpatient treatment. Patient claims that he has had a suicide attempt in the past although he doesn't offer any more details about it.  Risk to Self: Suicidal Ideation: Yes-Currently Present Suicidal Intent: Yes-Currently Present Is patient at risk for suicide?: Yes Suicidal Plan?: Yes-Currently Present Specify Current Suicidal Plan: Overdosing on medications Access to Means: Yes Specify Access to Suicidal Means: Medications What has been your use of drugs/alcohol within the last 12 months?: Cocaine & Alcohol How many times?: 4 Other Self Harm Risks: Active Addiction Triggers for Past Attempts: None known Intentional Self Injurious Behavior: None Risk to Others: Homicidal Ideation: No Thoughts of Harm to Others: No Current Homicidal Intent: No Current Homicidal Plan: No Access to Homicidal Means: No Identified Victim: Reports of none History of harm to others?: No Assessment of Violence: None Noted Violent Behavior Description: Reports of none Does patient have access to weapons?: No Criminal Charges Pending?: No Does patient have a court date: No Prior Inpatient Therapy: Prior Inpatient Therapy: Yes Prior Therapy Dates: 04/2014, 05/2013 and unable to remember the other dates Prior Therapy Facilty/Provider(s): [place] Reason for Treatment: Depression and substance use Prior Outpatient Therapy: Prior  Outpatient Therapy: Yes Prior Therapy Dates: [facility] Prior Therapy Facilty/Provider(s): 2015 Reason for Treatment: Depression and substance use Does patient  have an ACCT team?: No Does patient have Intensive In-House Services?  : No Does patient have [facility] services? : No Does patient have P4CC services?: No  Past Medical History: No past medical history on file. No past surgical history on file. Family History: No family history on file. Family Psychiatric  History: No information given Social History:  History  Alcohol Use  . Yes     History  Drug Use  . Types: Cocaine, Marijuana    Social History   Social History  . Marital status: Legally Separated    Spouse name: N/A  . Number of children: N/A  . Years of education: N/A   Social History Main Topics  . Smoking status: Current Every Day Smoker  . Smokeless tobacco: Not on file  . Alcohol use Yes  . Drug use:     Types: Cocaine, Marijuana  . Sexual activity: Not on file   Other Topics Concern  . Not on file   Social History Narrative  . No narrative on file   Additional Social History:    Allergies:  No Known Allergies  Labs:  Results for orders placed or performed during the hospital encounter of 08/04/16 (from the past 48 hour(s))  CBC     Status: Abnormal   Collection Time: 08/04/16  7:16 AM  Result Value Ref Range   WBC 10.8 (H) 3.8 - 10.6 K/uL   RBC 5.52 4.40 - 5.90 MIL/uL   Hemoglobin 15.1 13.0 - 18.0 g/dL   HCT 45.8 40.0 - 52.0 %   MCV 83.0 80.0 - 100.0 fL   MCH 27.4 26.0 - 34.0 pg   MCHC 33.1 32.0 - 36.0 g/dL   RDW 15.2 (H) 11.5 - 14.5 %   Platelets 279 150 - 440 K/uL  Basic metabolic panel     Status: None   Collection Time: 08/04/16  7:16 AM  Result Value Ref Range   Sodium 143 135 - 145 mmol/L   Potassium 3.9 3.5 - 5.1 mmol/L   Chloride 109 101 - 111 mmol/L   CO2 25 22 - 32 mmol/L   Glucose, Bld 97 65 - 99 mg/dL   BUN 8 6 - 20 mg/dL   Creatinine, Ser 0.92 0.61 - 1.24 mg/dL   Calcium 9.1 8.9 - 10.3 mg/dL   GFR calc non Af Amer >60 >60 mL/min   GFR calc Af Amer >60 >60 mL/min    Comment: (NOTE) The eGFR has been calculated using the CKD  EPI equation. This calculation has not been validated in all clinical situations. eGFR's persistently <60 mL/min signify possible Chronic Kidney Disease.    Anion gap 9 5 - 15  Ethanol     Status: Abnormal   Collection Time: 08/04/16  7:16 AM  Result Value Ref Range   Alcohol, Ethyl (B) 210 (H) <5 mg/dL    Comment:        LOWEST DETECTABLE LIMIT FOR SERUM ALCOHOL IS 5 mg/dL FOR MEDICAL PURPOSES ONLY   Urine Drug Screen, Qualitative ([facility] only)     Status: Abnormal   Collection Time: 08/04/16  7:16 AM  Result Value Ref Range   Tricyclic, Ur Screen NONE DETECTED NONE DETECTED   Amphetamines, Ur Screen NONE DETECTED NONE DETECTED   MDMA (Ecstasy)Ur Screen NONE DETECTED NONE DETECTED   Cocaine Metabolite,Ur [place] POSITIVE (A) NONE DETECTED   Opiate, Ur  Screen NONE DETECTED NONE DETECTED   Phencyclidine (PCP) Ur S NONE DETECTED NONE DETECTED   Cannabinoid 50 Ng, Ur [place] NONE DETECTED NONE DETECTED   Barbiturates, Ur Screen NONE DETECTED NONE DETECTED   Benzodiazepine, Ur Scrn NONE DETECTED NONE DETECTED   Methadone Scn, Ur NONE DETECTED NONE DETECTED    Comment: (NOTE) 320  Tricyclics, urine               Cutoff 1000 ng/mL 200  Amphetamines, urine             Cutoff 1000 ng/mL 300  MDMA (Ecstasy), urine           Cutoff 500 ng/mL 400  Cocaine Metabolite, urine       Cutoff 300 ng/mL 500  Opiate, urine                   Cutoff 300 ng/mL 600  Phencyclidine (PCP), urine      Cutoff 25 ng/mL 700  Cannabinoid, urine              Cutoff 50 ng/mL 800  Barbiturates, urine             Cutoff 200 ng/mL 900  Benzodiazepine, urine           Cutoff 200 ng/mL 1000 Methadone, urine                Cutoff 300 ng/mL 1100 1200 The urine drug screen provides only a preliminary, unconfirmed 1300 analytical test result and should not be used for non-medical 1400 purposes. Clinical consideration and professional judgment should 1500 be applied to any positive drug screen result due to possible 1600  interfering substances. A more specific alternate chemical method 1700 must be used in order to obtain a confirmed analytical result.  1800 Gas chromato graphy / mass spectrometry (GC/MS) is the preferred 1900 confirmatory method.     Current Facility-Administered Medications  Medication Dose Route Frequency Provider Last Rate Last Dose  . LORazepam (ATIVAN) injection 0-4 mg  0-4 mg Intravenous Q6H Delman Kitten, MD       Followed by  . [START ON 08/06/2016] LORazepam (ATIVAN) injection 0-4 mg  0-4 mg Intravenous Q12H Delman Kitten, MD       No current outpatient prescriptions on file.    Musculoskeletal: Strength & Muscle Tone: decreased Gait & Station: ataxic Patient leans: N/A  Psychiatric Specialty Exam: Physical Exam  Nursing note and vitals reviewed. Constitutional: He appears well-developed and well-nourished.  HENT:  Head: Normocephalic and atraumatic.  Eyes: Conjunctivae are normal. Pupils are equal, round, and reactive to light.  Neck: Normal range of motion.  Cardiovascular: Regular rhythm and normal heart sounds.   Respiratory: Effort normal. No respiratory distress.  GI: Soft.  Musculoskeletal: Normal range of motion.       Legs: Neurological: He is alert.  Skin: Skin is warm and dry.  Psychiatric: Judgment normal. His affect is blunt. His speech is delayed. He is slowed and withdrawn. He exhibits a depressed mood. He expresses suicidal ideation. He exhibits abnormal recent memory.    Review of Systems  Constitutional: Negative.   HENT: Negative.   Eyes: Negative.   Respiratory: Negative.   Cardiovascular: Negative.   Gastrointestinal: Negative.   Musculoskeletal: Negative.   Skin: Negative.   Neurological: Positive for seizures.  Psychiatric/Behavioral: Positive for depression, memory loss, substance abuse and suicidal ideas. Negative for hallucinations. The patient is nervous/anxious and has insomnia.     Blood pressure 104/85,  pulse 88, temperature 98.1 F  (36.7 C), temperature source Oral, resp. rate 18, height 6' (1.829 m), weight 74.8 kg (165 lb), SpO2 97 %.Body mass index is 22.38 kg/m.  General Appearance: Disheveled  Eye Contact:  Minimal  Speech:  Slow and Slurred  Volume:  Decreased  Mood:  Depressed and Dysphoric  Affect:  Congruent  Thought Process:  Disorganized  Orientation:  Full (Time, Place, and Person)  Thought Content:  Slow. Incomplete. Doesn't talk very much.  Suicidal Thoughts:  Yes.  with intent/plan  Homicidal Thoughts:  No  Memory:  Immediate;   Fair Recent;   Poor Remote;   Fair  Judgement:  Fair  Insight:  Fair  Psychomotor Activity:  Decreased  Concentration:  Concentration: Fair  Recall:  [employer] of Knowledge:  Fair  Language:  Fair  Akathisia:  No  Handed:  Right  AIMS (if indicated):     Assets:  Desire for Improvement Physical Health Resilience  ADL's:  Intact  Cognition:  Impaired,  Mild  Sleep:        Treatment Plan Summary: Daily contact with patient to assess and evaluate symptoms and progress in treatment, Medication management and Plan 49 year old man with depression and suicidal thoughts. Came in with a blood alcohol level over 200. Screen positive for cocaine. Very depressed and withdrawn. He will be admitted to the psychiatric ward. Continue involuntary commitment. Detox protocol in place. Restart citalopram. 15 minute checks and involvement in appropriate groups and evaluation.  Disposition: Recommend psychiatric Inpatient admission when medically cleared.  Alethia Berthold, MD 08/04/2016 4:24 PM

## 2016-08-04 NOTE — Progress Notes (Signed)
Patient ID: Sean Hoffman, male   DOB: 07/09/1966, 50 y.o.   MRN: [id number]  [facility] Pt is alert and oriented x4. Pt gait is unsteady d/t right leg amputation. Pt has a prosthesis. He reports difficulty dressing and bathing d/t amputation. Shower chair placed in pt room. Pt reports "I had passed out because I had 2 seizures. When I woke up I was really depressed. I was hoping to go to sleep and not wake up." Pt reports regular alcohol intake, "if I drink I'm just gonna drink till I pass out. I binge drink." CIWA scored a 1 at this time. Pt reports decreased appetite and weight loss. Pt c/o hemorrhoid pain. PRN medication ordered. Pt rates anxiety 5/10 and depression 7/10. Denies SI/HI/AVH at this time and contracts for safety. Pt identifies his fiance as his support system. Pt is unemployed and lives alone. He identifies his stressors as "living arrangements." Pt reports that is goal during admission is "depression...trying to get it under control. Getting my meds back so I can get myself back to normal." Skin assessment performed with Abi,RN. No contraband found. See separate note. Pt oriented to unit. q15 minute safety checks maintained. Pt remains free from harm. Will continue to monitor.

## 2016-08-04 NOTE — ED Notes (Signed)
Breakfast was placed in room patient sleeping at this time 

## 2016-08-04 NOTE — ED Notes (Signed)
BEHAVIORAL HEALTH ROUNDING Patient sleeping: No. Patient alert and oriented: yes Behavior appropriate: Yes.  ; If no, describe:  Nutrition and fluids offered: yes Toileting and hygiene offered: Yes  Sitter present: q15 minute observations and security  monitoring Law enforcement present: Yes  ODS  Pt provided a phone per his request  NAD observed
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
## 2016-08-04 NOTE — BH Assessment (Signed)
[facility] by Dr. Toni Amendlapacs.  Attending Physician will be Dr. Ardyth HarpsHernandez.   Patient [facility] [place].   Intake Paper Work has been signed and placed on patient chart.  ER staff is aware of the admission Elder Love([place], ER Sect.; Dr. Darnelle CatalanMalinda, ER MD; Murlean IbaAmy T., Patient's Nurse & Byrd HesselbachMaria, Patient Access).

## 2016-08-04 NOTE — ED Provider Notes (Signed)
[facility] Emergency Department Provider Note   ____________________________________________   First MD Initiated Contact with Patient 08/04/16 0703     (approximate)  I have reviewed the triage vital signs and the nursing notes.   HISTORY  Chief Complaint Needs help   HPI Sean EngKeith Lansberry is a 50 y.o. male reported suicidal ideation.  Patient reports that he is not sure, and he can't describe the thinks he had possibly 2 seizures today. Patient reports he only remembers one of them, but tells me he can't describe it any way.  He does report a history of possible seizures related to "alcohol" in the past.   He has a small scrape on his left hand but otherwise reports that he is just very "depressed". He feels helpless.  Patient can't further describe the seizures. Patient was not brought by EMS, police report that they're called out for possible seizure but on arrival there patient was noted to have suicidal ideations, thus placed under involuntary commitment.  Past Medical History:  Diagnosis Date  . Medical history non-contributory     Patient Active Problem List   Diagnosis Date Noted  . HTN (hypertension) 08/06/2016  . Alcohol use disorder, severe, dependence (HCC) 08/05/2016  . Cocaine use disorder, moderate, dependence (HCC) 08/05/2016  . Tobacco use disorder 08/05/2016  . Severe recurrent major depression without psychotic features (HCC) 08/04/2016    Past Surgical History:  Procedure Laterality Date  . ABOVE KNEE LEG AMPUTATION Right     Prior to Admission medications   Medication Sig Start Date End Date Taking? Authorizing Provider  amLODipine (NORVASC) 2.5 MG tablet Take 1 tablet (2.5 mg total) by mouth daily. 08/06/16   Jimmy FootmanAndrea Hernandez-Gonzalez, MD  citalopram (CELEXA) 20 MG tablet Take 1 tablet (20 mg total) by mouth daily. 08/07/16   Jimmy FootmanAndrea Hernandez-Gonzalez, MD  traZODone (DESYREL) 100 MG tablet Take 1 tablet (100 mg total) by  mouth at bedtime as needed for sleep. 08/06/16   Jimmy FootmanAndrea Hernandez-Gonzalez, MD    Allergies Patient has no known allergies.  No family history on file.  Social History Social History  Substance Use Topics  . Smoking status: Current Every Day Smoker    Packs/day: 2.00    Types: Cigarettes, E-cigarettes  . Smokeless tobacco: Never Used  . Alcohol use Yes     Comment: "If I drink I'm just gonna drink till I pass out. I binge drink."  Patient reports smoking a regular basis, he also drinks "heavily" every day but can't really quantify how much  Review of Systems Constitutional: No fever/chills Eyes: No visual changes. ENT: No sore throat. Cardiovascular: Denies chest pain. Respiratory: Denies shortness of breath. Gastrointestinal: No abdominal pain.  No nausea, no vomiting.  No diarrhea.  No constipation. Genitourinary: Negative for dysuria. Musculoskeletal: Negative for back pain. Skin: Negative for rash. Neurological: Negative for headaches, focal weakness or numbness. See history of present illness regarding seizures 10-point ROS otherwise negative.  ____________________________________________   PHYSICAL EXAM:  VITAL SIGNS: ED Triage Vitals [08/04/16 0704]  Enc Vitals Group     BP (!) 151/111     Pulse Rate 92     Resp 18     Temp 98.3 F (36.8 C)     Temp Source Oral     SpO2 96 %     Weight      Height      Head Circumference      Peak Flow      Pain Score  Pain Loc      Pain Edu?      Excl. in GC?     Constitutional: Alert and oriented. Patient appears anxious, slightly agitated but calm's when spoken to. The patient does appear labile emotion Eyes: Conjunctivae are normal. PERRL. EOMI. Head: Atraumatic. Nose: No congestion/rhinnorhea. Mouth/Throat: Mucous membranes are moist.  Oropharynx non-erythematous. Neck: No stridor.   Cardiovascular: Normal rate, regular rhythm. Grossly normal heart sounds.  Good peripheral circulation. Respiratory:  Normal respiratory effort.  No retractions. Lungs CTAB. Gastrointestinal: Soft and nontender. No distention.  Musculoskeletal: No lower extremity tenderness nor edema.  No joint effusions. Right lower extremity BKA. Neurologic:  Normal speech and language. No gross focal neurologic deficits are appreciated.  Skin:  Skin is warm, dry and intact. No rash noted. Psychiatric: Mood and affect are normal. Speech and behavior are slightly pressured, slightly labile. The patient reports having suicidal thoughts off and on, denies hallucinations.  Patient reports he supposed to be on medicine but has not taken them for a very long time, usually stops taking them shortly after leaving the hospital.  ____________________________________________   LABS (all labs ordered are listed, but only abnormal results are displayed)  Labs Reviewed  CBC - Abnormal; Notable for the following:       Result Value   WBC 10.8 (*)    RDW 15.2 (*)    All other components within normal limits  ETHANOL - Abnormal; Notable for the following:    Alcohol, Ethyl (B) 210 (*)    All other components within normal limits  URINE DRUG SCREEN, QUALITATIVE ([facility] ONLY) - Abnormal; Notable for the following:    Cocaine Metabolite,[place] POSITIVE (*)    All other components within normal limits  BASIC METABOLIC PANEL   ____________________________________________  EKG   ____________________________________________  RADIOLOGY   ____________________________________________   PROCEDURES  Procedure(s) performed: None  Procedures  Critical Care performed: No  ____________________________________________   INITIAL IMPRESSION / ASSESSMENT AND PLAN / ED COURSE  Pertinent labs & imaging results that were available during my care of the patient were reviewed by me and considered in my medical decision making (see chart for details).  Patient presents for evaluation for suicidal ideation, labile mood under involuntary  commitment. He does report possibly having a seizure-type episode today, but no evidence of hyper agitation, tremors, or acute withdrawal noted at initial examination ER. He reports he's had similar episodes in the past when he's gone through alcohol withdrawal, and treats all of his "seizures" to previous alcohol use. I will place him under involuntary commitment, place him on withdrawal protocol, requested psychiatric consult. Patient is labile mood, slightly elevated total provide Ativan and Haldol by mouth here as initial treatment.  Medical screening labs reviewed. Patient positive for ethanol and cocaine. Patient resting comfortably at this time awaiting psychiatric consult.  Clinical Course      ____________________________________________   FINAL CLINICAL IMPRESSION(S) / ED DIAGNOSES  Final diagnoses:  Severe episode of recurrent major depressive disorder, without psychotic features (HCC)      NEW MEDICATIONS STARTED DURING THIS VISIT:  There are no discharge medications for this patient.    Note:  This document was prepared using Dragon voice recognition software and may include unintentional dictation errors.     Sharyn CreamerMark Vickii Volland, MD 08/07/16 1024

## 2016-08-05 DIAGNOSIS — F1420 Cocaine dependence, uncomplicated: Secondary | ICD-10-CM

## 2016-08-05 DIAGNOSIS — F332 Major depressive disorder, recurrent severe without psychotic features: Principal | ICD-10-CM

## 2016-08-05 DIAGNOSIS — F17200 Nicotine dependence, unspecified, uncomplicated: Secondary | ICD-10-CM

## 2016-08-05 DIAGNOSIS — F1020 Alcohol dependence, uncomplicated: Secondary | ICD-10-CM

## 2016-08-05 LAB — LIPID PANEL
Cholesterol: 164 mg/dL (ref 0–200)
HDL: 70 mg/dL (ref >40)
LDL Cholesterol: 83 mg/dL (ref 0–99)
Total CHOL/HDL Ratio: 2.3 RATIO
Triglycerides: 55 mg/dL (ref <150)
VLDL: 11 mg/dL (ref 0–40)

## 2016-08-05 LAB — TSH: TSH: 0.382 u[IU]/mL (ref 0.350–4.500)

## 2016-08-05 MED ORDER — ENSURE ENLIVE PO LIQD
237.0000 mL | Freq: Two times a day (BID) | ORAL | Status: DC
  Administered 2016-08-06: 237 mL via ORAL

## 2016-08-05 NOTE — [facility] Suicide Risk Assessment (Signed)
[facility] INPATIENT:  Family/Significant Other Suicide Prevention Education  Suicide Prevention Education:  Education Completed;Sean Hoffman (fiance' [phone]), has been identified by the patient as the family member/significant other with whom the patient will be residing, and identified as the person(s) who will aid the patient in the event of a mental health crisis (suicidal ideations/suicide attempt).  With written consent from the patient, the family member/significant other has been provided the following suicide prevention education, prior to the and/or following the discharge of the patient.  The suicide prevention education provided includes the following:  Suicide risk factors  Suicide prevention and interventions  National Suicide Hotline telephone number  [facility] assessment telephone number  [facility] Emergency Assistance 911  [facility] and/or Residential Mobile Crisis [facility] telephone number  Request made of family/significant other to:  Remove weapons (e.g., guns, rifles, knives), all items previously/currently identified as safety concern.    Remove drugs/medications (over-the-counter, prescriptions, illicit drugs), all items previously/currently identified as a safety concern.  The family member/significant other verbalizes understanding of the suicide prevention education information provided.  The family member/significant other agrees to remove the items of safety concern listed above.  Mikhail Hallenbeck G. Garnette CzechSampson MSW, LCSWA 08/05/2016, 3:07 PM

## 2016-08-05 NOTE — Tx Team (Signed)
Interdisciplinary Treatment and Diagnostic Plan Update  08/05/2016 Time of Session: 10:30am Sean Hoffman MRN: [id number]  Principal Diagnosis: Severe recurrent major depression without psychotic features [facility](HCC)  Secondary Diagnoses: Principal Problem:   Severe recurrent major depression without psychotic features (HCC) Active Problems:   Alcohol use disorder, severe, dependence (HCC)   Cocaine use disorder, moderate, dependence (HCC)   Tobacco use disorder   Current Medications:  Current Facility-Administered Medications  Medication Dose Route Frequency Provider Last Rate Last Dose  . acetaminophen (TYLENOL) tablet 650 mg  650 mg Oral Q6H PRN Audery AmelJohn T Clapacs, MD      . alum & mag hydroxide-simeth (MAALOX/MYLANTA) 200-200-20 MG/5ML suspension 30 mL  30 mL Oral Q4H PRN Audery AmelJohn T Clapacs, MD      . citalopram (CELEXA) tablet 20 mg  20 mg Oral Daily Audery AmelJohn T Clapacs, MD   20 mg at 08/05/16 0825  . feeding supplement (ENSURE ENLIVE) (ENSURE ENLIVE) liquid 237 mL  237 mL Oral BID BM Jimmy FootmanAndrea Hernandez-Gonzalez, MD      . Influenza vac split quadrivalent PF (FLUARIX) injection 0.5 mL  0.5 mL Intramuscular Tomorrow-1000 Jimmy FootmanAndrea Hernandez-Gonzalez, MD      . magnesium hydroxide (MILK OF MAGNESIA) suspension 30 mL  30 mL Oral Daily PRN Audery AmelJohn T Clapacs, MD      . nicotine (NICODERM CQ - dosed in mg/24 hours) patch 21 mg  21 mg Transdermal Daily Jimmy FootmanAndrea Hernandez-Gonzalez, MD   21 mg at 08/05/16 0825  . phenylephrine-shark liver oil-mineral oil-petrolatum (PREPARATION H) rectal ointment   Rectal BID PRN Jimmy FootmanAndrea Hernandez-Gonzalez, MD      . pneumococcal 23 valent vaccine (PNU-IMMUNE) injection 0.5 mL  0.5 mL Intramuscular Tomorrow-1000 Jimmy FootmanAndrea Hernandez-Gonzalez, MD      . traZODone (DESYREL) tablet 100 mg  100 mg Oral QHS PRN Jimmy FootmanAndrea Hernandez-Gonzalez, MD   100 mg at 08/05/16 0005   PTA Medications: No prescriptions prior to admission.    Patient Stressors: Medication change or noncompliance Substance  abuse Other: "Living arrangements"  Patient Strengths: Active sense of humor Capable of independent living General fund of knowledge  Treatment Modalities: Medication Management, Group therapy, Case management,  1 to 1 session with clinician, Psychoeducation, Recreational therapy.   Physician Treatment Plan for Primary Diagnosis: Severe recurrent major depression without psychotic features (HCC) Long Term Goal(s): Improvement in symptoms so as ready for discharge Improvement in symptoms so as ready for discharge   Short Term Goals: Ability to identify changes in lifestyle to reduce recurrence of condition will improve Ability to verbalize feelings will improve Ability to demonstrate self-control will improve Ability to identify and develop effective coping behaviors will improve Compliance with prescribed medications will improve Ability to identify triggers associated with substance abuse/mental health issues will improve Ability to identify changes in lifestyle to reduce recurrence of condition will improve Ability to identify and develop effective coping behaviors will improve Ability to identify triggers associated with substance abuse/mental health issues will improve  Medication Management: Evaluate patient's response, side effects, and tolerance of medication regimen.  Therapeutic Interventions: 1 to 1 sessions, Unit Group sessions and Medication administration.  Evaluation of Outcomes: Progressing  Physician Treatment Plan for Secondary Diagnosis: Principal Problem:   Severe recurrent major depression without psychotic features (HCC) Active Problems:   Alcohol use disorder, severe, dependence (HCC)   Cocaine use disorder, moderate, dependence (HCC)   Tobacco use disorder  Long Term Goal(s): Improvement in symptoms so as ready for discharge Improvement in symptoms so as ready for discharge  Short Term Goals: Ability to identify changes in lifestyle to reduce  recurrence of condition will improve Ability to verbalize feelings will improve Ability to demonstrate self-control will improve Ability to identify and develop effective coping behaviors will improve Compliance with prescribed medications will improve Ability to identify triggers associated with substance abuse/mental health issues will improve Ability to identify changes in lifestyle to reduce recurrence of condition will improve Ability to identify and develop effective coping behaviors will improve Ability to identify triggers associated with substance abuse/mental health issues will improve     Medication Management: Evaluate patient's response, side effects, and tolerance of medication regimen.  Therapeutic Interventions: 1 to 1 sessions, Unit Group sessions and Medication administration.  Evaluation of Outcomes: Progressing   RN Treatment Plan for Primary Diagnosis: Severe recurrent major depression without psychotic features (HCC) Long Term Goal(s): Knowledge of disease and therapeutic regimen to maintain health will improve  Short Term Goals: Ability to verbalize feelings will improve, Ability to disclose and discuss suicidal ideas, Ability to identify and develop effective coping behaviors will improve and Compliance with prescribed medications will improve  Medication Management: RN will administer medications as ordered by provider, will assess and evaluate patient's response and provide education to patient for prescribed medication. RN will report any adverse and/or side effects to prescribing provider.  Therapeutic Interventions: 1 on 1 counseling sessions, Psychoeducation, Medication administration, Evaluate responses to treatment, Monitor vital signs and CBGs as ordered, Perform/monitor CIWA, COWS, AIMS and Fall Risk screenings as ordered, Perform wound care treatments as ordered.  Evaluation of Outcomes: Progressing   LCSW Treatment Plan for Primary Diagnosis: Severe  recurrent major depression without psychotic features (HCC) Long Term Goal(s): Safe transition to appropriate next level of care at discharge, Engage patient in therapeutic group addressing interpersonal concerns.  Short Term Goals: Engage patient in aftercare planning with referrals and resources, Increase social support, Increase ability to appropriately verbalize feelings, Increase emotional regulation, Facilitate acceptance of mental health diagnosis and concerns, Facilitate patient progression through stages of change regarding substance use diagnoses and concerns, Identify triggers associated with mental health/substance abuse issues and Increase skills for wellness and recovery  Therapeutic Interventions: Assess for all discharge needs, 1 to 1 time with Social worker, Explore available resources and support systems, Assess for adequacy in community support network, Educate family and significant other(s) on suicide prevention, Complete Psychosocial Assessment, Interpersonal group therapy.  Evaluation of Outcomes: Progressing   Progress in Treatment: Attending groups: No. Participating in groups: No. Taking medication as prescribed: Yes. Toleration medication: Yes. Family/Significant other contact made: Yes, individual(s) contacted:  fiance' Patient understands diagnosis: Yes. Discussing patient identified problems/goals with staff: Yes. Medical problems stabilized or resolved: Yes. Denies suicidal/homicidal ideation: Yes. Issues/concerns per patient self-inventory: No. Other: n/a  New problem(s) identified: None identified at this time.   New Short Term/Long Term Goal(s):None identified at this time.   Discharge Plan or Barriers: Patient will discharge back home to live with his wife and follow-up with [facility] substance abuse intensive outpatient program for outpatient services with therapy and medication management.   Reason for Continuation of Hospitalization: Depression Medication  stabilization  Estimated Length of Stay: 3 days.   Attendees: Patient:Sean Hoffman 08/05/2016 5:03 PM  Physician: Dr. Radene JourneyJayme Cloud, MD 08/05/2016 5:03 PM  Nursing: Leonia Reader, RN 08/05/2016 5:03 PM  RN Care Manager: 08/05/2016 5:03 PM  Social Worker: Fredrich Birks. Garnette Czech MSW, LCSWA 08/05/2016 5:03 PM  Recreational Therapist: Jacquelynn Cree, LRT/CTRS 08/05/2016 5:03 PM  Other:  08/05/2016 5:03 PM  Other:  08/05/2016 5:03 PM  Other: 08/05/2016 5:03 PM    Scribe for Treatment Team: Arelia LongestAmaris G Yeilyn Gent, LCSWA 08/05/2016 5:06 PM

## 2016-08-05 NOTE — [facility] Counselor (Signed)
Adult Comprehensive Assessment  Patient ID: Sean Hoffman, male   DOB: 01/18/1966, 50 y.o.   MRN: [id number]  Information Source: Information source: Patient  Current Stressors:  Educational / Learning stressors: n/a Employment / Job issues: Pt is unemployed and is on disability Family Relationships: n/a Surveyor, quantityinancial / Lack of resources (include bankruptcy): n/a Housing / Lack of housing: n/a Physical health (include injuries & life threatening diseases): Pt has an amputated right leg. Social relationships: n/a Substance abuse: Pt has a hx of cocaine and alcohol abuse. Bereavement / Loss: Pt lost his mother in 2015 to massive heart attack.   Living/Environment/Situation:  Living Arrangements: Spouse/significant other Living conditions (as described by patient or guardian): Pt states that it is fine. How long has patient lived in current situation?: 3 years. What is atmosphere in current home: Comfortable, Loving, Supportive  Family History:  Marital status: Divorced Divorced, when?: 2000 What types of issues is patient dealing with in the relationship?: Pt did not answer Additional relationship information: Pt is in a current long term relationship and states he has fiance'. Are you sexually active?: Yes What is your sexual orientation?: heterosexual Has your sexual activity been affected by drugs, alcohol, medication, or emotional stress?: n/a Does patient have children?: Yes How many children?: 2 How is patient's relationship with their children?: Patient has 1 daughter and 1 son. Pt states he has a great relationship with his daughter. Pt states he doesn't speak to his son as much because he lives away.  Childhood History:  By whom was/is the patient raised?: Mother Additional childhood history information: Pt stated his father was not involved growing up.  Description of patient's relationship with caregiver when they were a child: Pt states his mother was supportive to him and was  always there for him.  Patient's description of current relationship with people who raised him/her: Pt mother is deceased. Pt states he has a good relationship with his father now. How were you disciplined when you got in trouble as a child/adolescent?: n/a Does patient have siblings?: Yes Number of Siblings: 2 Description of patient's current relationship with siblings: Pt has 2 sisters. States they have a wonderful relationship.  Did patient suffer any verbal/emotional/physical/sexual abuse as a child?: No Did patient suffer from severe childhood neglect?: No Has patient ever been sexually abused/assaulted/raped as an adolescent or adult?: No Was the patient ever a victim of a crime or a disaster?: No Witnessed domestic violence?: No Has patient been effected by domestic violence as an adult?: No  Education:  Highest grade of school patient has completed: Some Teaching laboratory technicianCollege ([employer]) Currently a Consulting civil engineerstudent?: No Name of school: n/a Learning disability?: No  Employment/Work Situation:   Employment situation: On disability Why is patient on disability: Medical reasons How long has patient been on disability: 2010 Patient's job has been impacted by current illness: No What is the longest time patient has a held a job?: [employer] Where was the patient employed at that time?: 4 years Has patient ever been in the Eli Lilly and Companymilitary?: No Has patient ever served in combat?: No Did You Receive Any Psychiatric Treatment/Services While in Equities traderthe Military?: No Are There Guns or Other Weapons in Your Home?: No Are These ComptrollerWeapons Safely Secured?:  (n/a)  Financial Resources:   Financial resources: Insurance claims handlereceives SSDI Does patient have a Lawyerrepresentative payee or guardian?: No  Alcohol/Substance Abuse:   What has been your use of drugs/alcohol within the last 12 months?: Cocaine and Alcohol If attempted suicide, did drugs/alcohol play a  role in this?: No Alcohol/Substance Abuse Treatment Hx: Past Tx, Inpatient, Attends  AA/NA If yes, [facility] Has alcohol/substance abuse ever caused legal problems?: No  Social Support System:   Patient's Community Support System: Good Describe Community Support System: Pt has support from fiance', daughter, and sisters. Type of faith/religion: Baptist How does patient's faith help to cope with current illness?: n/a  Leisure/Recreation:   Leisure and Hobbies: Spending time with grandchildren, watching tv, and cooking  Strengths/Needs:   What things does the patient do well?: Communicate, cooking, motivated by his faith In what areas does patient struggle / problems for patient: depression, alcohol and cocaine use  Discharge Plan:   Does patient have access to transportation?: Yes (Either fiance' or daughter) Will patient be returning to same living situation after discharge?: Yes Currently receiving community mental health services: No If no, would patient like referral for services when discharged?: Yes (What [place] [place] Does patient have financial barriers related to discharge medications?: No  Summary/Recommendations:   Patient is a 50 year old male admitted involuntary with a diagnosis of severe recurrent major depression without psychotic features. Information was obtained from psychosocial assessment completed with patient and chart review conducted by this evaluator. Patient presented to the hospital voicing SI with plans to overdose. Patient reports primary triggers for admission were his chronic alcohol and cocaine use, and the passing of his mother in [id number]. Patient has support from his fiances', daughter, and sister. Patient plans to follow-up with [facility] substance abuse intensive outpatient program. Patient will benefit from crisis stabilization, medication evaluation, group therapy and psycho education in addition to case management for discharge. At discharge, it is recommended that patient remain  compliant with established discharge plan and continued treatment.   Katilin Raynes G. Garnette CzechSampson MSW, LCSWA 08/05/2016 3:00 PM
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
## 2016-08-05 NOTE — Progress Notes (Signed)
CH responded to an OR for Suicidal Thoughts. Pt was in bed in a darkened room. Pt presented groggy as if waking from a nap. Pt indicated that he was in a better frame of mind than earlier. He had made a couple of phone calls which helped and had been medicated. Pt did not indicate a need for spiritual care at this time. CH is available for follow up as needed.    08/05/16 1400  Clinical Encounter Type  Visited With Patient;Health care provider  Visit Type Initial;Psychological support;Spiritual support  Referral From Nurse  Spiritual Encounters  Spiritual Needs Emotional  Stress Factors  Patient Stress Factors Not reviewed

## 2016-08-05 NOTE — Progress Notes (Signed)
Recreation Therapy Notes  INPATIENT RECREATION THERAPY ASSESSMENT  Patient Details Name: Sean Hoffman MRN: [id number] DOB: 11/28/1965 Today's Date: 08/05/2016  Patient Stressors: Death, Other (Comment) (Mother died a couple years ago; finances/bills)  Coping Skills:   Isolate, Substance Abuse, Avoidance, Art/Dance, Talking, Music, Sports, Other (Comment) (Laughter, play with girlfriend's nephew)  Personal Challenges: Anger, Communication, Concentration, Decision-Making, Problem-Solving, Self-Esteem/Confidence, Social Interaction, Stress Management, Substance Abuse, Time Management, Trusting Others  Leisure Interests (2+):  Individual - Other (Comment) (Watch sports, hand out with daughter)  Awareness of Community Resources:  No  Community Resources:     Current Use:    If no, Barriers?:    Patient Strengths:  "No"  Patient Identified Areas of Improvement:  Managing time and self-esteem  Current Recreation Participation:  Watching football  Patient Goal for Hospitalization:  To get back on medication  [place]  [place] of Residence:  [address]   Current SI (including self-harm):  No  Current HI:  No  Consent to Intern Participation: N/A   Jacquelynn CreeGreene,Danah Reinecke M, LRT/CTRS 08/05/2016, 3:33 PM

## 2016-08-05 NOTE — Plan of Care (Signed)
Problem: Health Behavior/Discharge Planning: Goal: Identification of resources available to assist in meeting health care needs will improve Outcome: Progressing Compliant  With unit programing  Improving on coping skills

## 2016-08-05 NOTE — [facility] Group Notes (Signed)
[facility] LCSW Group Therapy  08/05/2016 10:29 AM  Type of Therapy:  Group Therapy  Participation Level:  Patient did not attend group. CSW invited patient to group.   Summary of Progress/Problems:Emotional Regulation: Patients will identify both negative and positive emotions. They will discuss emotions they have difficulty regulating and how they impact their lives. Patients will be asked to identify healthy coping skills to combat unhealthy reactions to negative emotions.    Triniti Gruetzmacher G. Garnette CzechSampson MSW, LCSWA 08/05/2016, 10:30 AM

## 2016-08-05 NOTE — Progress Notes (Signed)
Recreation Therapy Notes  Date: 11.15.17 Time: 1:00 pm Location: Craft Room  Group Topic: Self-esteem  Goal Area(s) Addresses:  Patient will be able to identify benefit of self-esteem. Patient will be able to identify ways to increase self-esteem.  Behavioral Response: Did not attend  Intervention: Self-Portrait  Activity: Patients were instructed to draw their self-portrait of how they were feeling. Patients were then instructed to write a positive trait about self and peers. Patients then drew a self-portrait of how they felt after reading positive traits from peers.  Education: LRT educated patients on ways they can increase their self-esteem.  Education Outcome: Patient did not attend group.   Clinical Observations/Feedback: Patient did not attend group.  Jacquelynn CreeGreene,Aislee Landgren M, LRT/CTRS 08/05/2016 3:42 PM

## 2016-08-05 NOTE — Progress Notes (Signed)
NUTRITION ASSESSMENT  Pt identified as at risk on the Malnutrition Screen Tool  INTERVENTION: 1. Educated patient on the importance of nutrition and encouraged intake of food and beverages. 2. Discussed weight goals. 3. Supplements: Ensure Enlive po BID, each supplement provides 350 kcal and 20 grams of protein  NUTRITION DIAGNOSIS: Unintentional weight loss related to sub-optimal intake as evidenced by pt report.   Goal: Pt to meet >/= 90% of their estimated nutrition needs.  Monitor:  PO intake  Assessment:  Ardine EngKeith Brue is a 50 y.o. male with a history of ETOH abuse and MDD presenting with suicidal thoughts without a specific plan. Drinking and using a lot of drugs currently. Appetite is improving. He thought he was 189# but turns out he was 170#. Said that he was mostly consuming snacks at home, nothing else really. Denies any issues chewing/swallowing or choking Denies nausea/vomiting He was amenable to trying Ensure during his stay  Height: Ht Readings from Last 1 Encounters:  08/04/16 6' 0.44" (1.84 m)    Weight: Wt Readings from Last 1 Encounters:  08/04/16 170 lb (77.1 kg)    Weight Hx: Wt Readings from Last 10 Encounters:  08/04/16 170 lb (77.1 kg)  08/04/16 165 lb (74.8 kg)    BMI:  Body mass index is 22.78 kg/m. Pt meets criteria for normal weight based on current BMI.  Estimated Nutritional Needs: Kcal: 25-30 kcal/kg Protein: > 1 gram protein/kg Fluid: 1 ml/kcal  Diet Order: Diet regular Room service appropriate? No; Fluid consistency: Thin Pt is also offered choice of unit snacks mid-morning and mid-afternoon.  Pt is eating as desired.   Lab results and medications reviewed.   Dionne AnoWilliam M. Makenleigh Crownover, MS, RD LDN Inpatient Clinical Dietitian Pager [phone]

## 2016-08-05 NOTE — [facility] Suicide Risk Assessment (Signed)
[facility]   Nursing information obtained from:  Patient, Review of record Demographic factors:  Male, Unemployed, Living alone Current Mental Status:  Self-harm thoughts Loss Factors:  Financial problems / change in socioeconomic status Historical Factors:  Prior suicide attempts Risk Reduction Factors:  Positive social support  Total Time spent with patient: 1 hour Principal Problem: Severe recurrent major depression without psychotic features ([facility]) Diagnosis:   Patient Active Problem List   Diagnosis Date Noted  . Alcohol use disorder, severe, dependence ([facility]) [F10.20] 08/05/2016  . Cocaine use disorder, moderate, dependence ([facility]) [F14.20] 08/05/2016  . Tobacco use disorder [F17.200] 08/05/2016  . Severe recurrent major depression without psychotic features ([facility]) [F33.2] 08/04/2016   Subjective Data:   Continued Clinical Symptoms:  Alcohol Use Disorder Identification Test Final Score (AUDIT): 22 The "Alcohol Use Disorders Identification Test", Guidelines for Use in Primary Care, Second Edition.  World Science writerHealth Organization [facility](WHO). Score between 0-7:  no or low risk or alcohol related problems. Score between 8-15:  moderate risk of alcohol related problems. Score between 16-19:  high risk of alcohol related problems. Score 20 or above:  warrants further diagnostic evaluation for alcohol dependence and treatment.   CLINICAL FACTORS:   Depression:   Comorbid alcohol abuse/dependence Alcohol/Substance Abuse/Dependencies Previous Psychiatric Diagnoses and Treatments Medical Diagnoses and Treatments/Surgeries   Musculoskeletal:  Psychiatric Specialty Exam: Physical Exam  ROS  Blood pressure 140/90, pulse 80, temperature 98.3 F (36.8 C), temperature source Oral, resp. rate 18, height 6' 0.44" (1.84 m), weight 77.1 kg (170 lb), SpO2 96 %.Body mass index is 22.78 kg/m.                                                    Sleep:   Number of Hours: 6.25      COGNITIVE FEATURES THAT CONTRIBUTE TO RISK:  None    SUICIDE RISK:   Mild:  Suicidal ideation of limited frequency, intensity, duration, and specificity.  There are no identifiable plans, no associated intent, mild dysphoria and related symptoms, good self-control (both objective and subjective assessment), few other risk factors, and identifiable protective factors, including available and accessible social support.   [facility] CARE[facility]  I certify that inpatient services furnished can reasonably be expected to improve the patient's condition.  Jimmy FootmanHernandez-Gonzalez,  Veralyn Lopp, MD 08/05/2016, 2:56 PM

## 2016-08-05 NOTE — Progress Notes (Signed)
D: Patient stated slept fair last night .Stated appetitefairand energy level  Is normal. Stated concentration is good . Stated on Depression scale 6 , hopeless 7  and anxiety 3 .( low 0-10 high) Denies suicidal  homicidal ideations  .  No auditory hallucinations  No pain concerns . Appropriate ADL'S. Interacting with peers and staff. Stay Focused  Attend groups A: Encourage patient participation with unit programming . Instruction  Given on  Medication , verbalize understanding. R: Voice no other concerns. Staff continue to monitor

## 2016-08-06 DIAGNOSIS — I10 Essential (primary) hypertension: Secondary | ICD-10-CM

## 2016-08-06 MED ORDER — TRAZODONE HCL 100 MG PO TABS: 100.0000 mg | ORAL_TABLET | Freq: Every evening | ORAL | 0 refills | Status: DC | PRN

## 2016-08-06 MED ORDER — AMLODIPINE BESYLATE 5 MG PO TABS
2.5000 mg | ORAL_TABLET | Freq: Every day | ORAL | Status: DC
Start: 2016-08-06 — End: 2016-08-06
  Administered 2016-08-06: 2.5 mg via ORAL
  Filled 2016-08-06: qty 1

## 2016-08-06 MED ORDER — AMLODIPINE BESYLATE 2.5 MG PO TABS: 2.5000 mg | ORAL_TABLET | Freq: Every day | ORAL | 0 refills | Status: DC

## 2016-08-06 MED ORDER — CITALOPRAM HYDROBROMIDE 20 MG PO TABS: 20.0000 mg | ORAL_TABLET | Freq: Every day | ORAL | 0 refills | Status: DC

## 2016-08-06 NOTE — Progress Notes (Signed)
Recreation Therapy Notes  INPATIENT RECREATION TR PLAN  Patient Details Name: Sean Hoffman MRN: [id number] DOB: 03-03-1966 Today's Date: 08/06/2016  Rec Therapy Plan Is patient appropriate for Therapeutic Recreation?: Yes Treatment times per week: At least once a week TR Treatment/Interventions: 1:1 session, Group participation (Comment) (Appropriate participation in daily recreational therapy tx)  Discharge Criteria Pt will be discharged from therapy if:: Treatment goals are met, Discharged Treatment plan/goals/alternatives discussed and agreed upon by:: Patient/family  Discharge Summary Short term goals set: See Care Plan Short term goals met: Complete Progress toward goals comments: One-to-one attended Which groups?: Leisure education One-to-one attended: Self-esteem, coping skills Reason goals not met: N/A Therapeutic equipment acquired: None Reason patient discharged from therapy: Discharge from hospital Pt/family agrees with progress & goals achieved: Yes Date patient discharged from therapy: 08/06/16   Leonette Monarch, LRT/CTRS 08/06/2016, 4:28 PM
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
## 2016-08-06 NOTE — [facility] Group Notes (Signed)
[facility] Group Notes:  (Nursing/MHT/Case Management/Adjunct)  Date:  08/06/2016  Time:  3:47 AM  Type of Therapy:  Psychoeducational Skills  Participation Level:  Active  Participation Quality:  Appropriate and Attentive  Affect:  Appropriate  Cognitive:  Appropriate  Insight:  Appropriate  Engagement in Group:  Engaged  Modes of Intervention:  Discussion, Socialization and Support  Summary of Progress/Problems:  Sean MilroyLaquanda Y [name] 08/06/2016, 3:47 AM

## 2016-08-06 NOTE — Plan of Care (Signed)
Problem: [facility] Participation in Recreation Therapeutic Interventions Goal: STG-Patient will demonstrate improved self esteem by identif STG: Self-Esteem - Within 3 treatment sessions, patient will verbalize at least 5 positive affirmation statements in one treatment session to increase self-esteem post d/c.  Outcome: Completed/Met Date Met: 08/06/16 Treatment Session 1; Completed 1 out of 1: At approximately 1:50 pm, LRT met with patient in craft room. Patient verbalized 5 positive affirmation statements. Patient reported it felt "great". LRT encouraged patient to continue saying positive affirmation statements.   Leonette Monarch, LRT/CTRS 11.16.17 4:25 pm Goal: STG-Patient will identify at least five coping skills for ** STG: Coping Skills - Within 3 treatment sessions, patient will verbalze at least 5 coping skills for substance abuse in one treatment session to decrease substance abuse post d/c.  Outcome: Completed/Met Date Met: 08/06/16 Treatment Session 1; Completed 1 out of 1: At approximately 1:50 pm, LRT met with patient in craft room. Patient verbalized 5 coping skills for substance abuse. LRT educated patient on leisure and why it is important to implement into his schedule. LRT educated and provided patient with blank schedules to help him plan his day and try to avoid using substances. LRT educated patient on healthy support systems.  Leonette Monarch, LRT/CTRS 11.16.17 4:27 pm

## 2016-08-06 NOTE — Progress Notes (Signed)
D:Patient aware of discharge this shift . Patient returning home . Patient received all belonging locked up . Patient denies  Suicidal  And homicidal ideations  .  A: Writer instructed on discharge criteria  . Informed of Discharge Summary , Transitional Record Suicidal risk Assessment  and prescriptions  given to patient . Aware  Of follow up appointment . R: Patient left unit with no questions  Or concerns  With family.

## 2016-08-06 NOTE — [facility] Group Notes (Signed)
[facility] LCSW Group Therapy  08/06/2016 2:17 PM  Type of Therapy:  Group Therapy  Participation Level:  Active  Participation Quality:  Appropriate  Affect:  Appropriate  Cognitive:  Appropriate  Insight:  Developing/Improving  Engagement in Therapy:  Engaged  Modes of Intervention:  Activity, Discussion, Education and Support  Summary of Progress/Problems:Balance in life: Patients will discuss the concept of balance and how it looks and feels to be unbalanced. Pt will identify areas in their life that is unbalanced and ways to become more balanced. Patient stated spending time with his daughter and grandchildren helps him cope with stress.  Vayden Weinand G. Garnette CzechSampson MSW, LCSWA 08/06/2016, 2:23 PM

## 2016-08-06 NOTE — Progress Notes (Signed)
Recreation Therapy Notes  Date: 11.16.17 Time: 1:00 pm Location: Craft Room  Group Topic: Leisure Education  Goal Area(s) Addresses:  Patient will ask peer questions related to leisure activities. Patient will verbalize benefit of using leisure as a Associate Professorcoping skill.  Behavioral Response: Attentive, Interactive  Intervention: Leisure Interview  Activity: Patients were paired up and given Leisure Interview worksheets. Patients were instructed to interview each other on leisure activities.  Education: LRT educated patient on what they need to participate in leisure.  Education Outcome: Acknowledges education/In group clarification offered  Clinical Observations/Feedback: Patient interviewed nursing student. Patient contributed to group discussion.  Jacquelynn CreeGreene,Rafia Shedden M, LRT/CTRS 08/06/2016 4:23 PM

## 2016-08-06 NOTE — Tx Team (Signed)
Interdisciplinary Treatment and Diagnostic Plan Update  08/06/2016 Time of Session: 10:30am Sean Hoffman MRN: [id number]  Principal Diagnosis: Severe recurrent major depression without psychotic features [facility](HCC)  Secondary Diagnoses: Principal Problem:   Severe recurrent major depression without psychotic features (HCC) Active Problems:   Alcohol use disorder, severe, dependence (HCC)   Cocaine use disorder, moderate, dependence (HCC)   Tobacco use disorder   Current Medications:  Current Facility-Administered Medications  Medication Dose Route Frequency Provider Last Rate Last Dose  . acetaminophen (TYLENOL) tablet 650 mg  650 mg Oral Q6H PRN Audery AmelJohn T Clapacs, MD      . alum & mag hydroxide-simeth (MAALOX/MYLANTA) 200-200-20 MG/5ML suspension 30 mL  30 mL Oral Q4H PRN Audery AmelJohn T Clapacs, MD      . amLODipine (NORVASC) tablet 2.5 mg  2.5 mg Oral Daily Jimmy FootmanAndrea Hernandez-Gonzalez, MD      . citalopram (CELEXA) tablet 20 mg  20 mg Oral Daily Audery AmelJohn T Clapacs, MD   20 mg at 08/06/16 0844  . feeding supplement (ENSURE ENLIVE) (ENSURE ENLIVE) liquid 237 mL  237 mL Oral BID BM Jimmy FootmanAndrea Hernandez-Gonzalez, MD   237 mL at 08/06/16 0943  . Influenza vac split quadrivalent PF (FLUARIX) injection 0.5 mL  0.5 mL Intramuscular Tomorrow-1000 Jimmy FootmanAndrea Hernandez-Gonzalez, MD      . magnesium hydroxide (MILK OF MAGNESIA) suspension 30 mL  30 mL Oral Daily PRN Audery AmelJohn T Clapacs, MD      . nicotine (NICODERM CQ - dosed in mg/24 hours) patch 21 mg  21 mg Transdermal Daily Jimmy FootmanAndrea Hernandez-Gonzalez, MD   21 mg at 08/06/16 0846  . phenylephrine-shark liver oil-mineral oil-petrolatum (PREPARATION H) rectal ointment   Rectal BID PRN Jimmy FootmanAndrea Hernandez-Gonzalez, MD      . pneumococcal 23 valent vaccine (PNU-IMMUNE) injection 0.5 mL  0.5 mL Intramuscular Tomorrow-1000 Jimmy FootmanAndrea Hernandez-Gonzalez, MD      . traZODone (DESYREL) tablet 100 mg  100 mg Oral QHS PRN Jimmy FootmanAndrea Hernandez-Gonzalez, MD   100 mg at 08/05/16 0005   PTA  Medications: No prescriptions prior to admission.    Patient Stressors: Medication change or noncompliance Substance abuse Other: "Living arrangements"  Patient Strengths: Active sense of humor Capable of independent living General fund of knowledge  Treatment Modalities: Medication Management, Group therapy, Case management,  1 to 1 session with clinician, Psychoeducation, Recreational therapy.   Physician Treatment Plan for Primary Diagnosis: Severe recurrent major depression without psychotic features (HCC) Long Term Goal(s): Improvement in symptoms so as ready for discharge Improvement in symptoms so as ready for discharge   Short Term Goals: Ability to identify changes in lifestyle to reduce recurrence of condition will improve Ability to verbalize feelings will improve Ability to demonstrate self-control will improve Ability to identify and develop effective coping behaviors will improve Compliance with prescribed medications will improve Ability to identify triggers associated with substance abuse/mental health issues will improve Ability to identify changes in lifestyle to reduce recurrence of condition will improve Ability to identify and develop effective coping behaviors will improve Ability to identify triggers associated with substance abuse/mental health issues will improve  Medication Management: Evaluate patient's response, side effects, and tolerance of medication regimen.  Therapeutic Interventions: 1 to 1 sessions, Unit Group sessions and Medication administration.  Evaluation of Outcomes: Adequate for Discharge  Physician Treatment Plan for Secondary Diagnosis: Principal Problem:   Severe recurrent major depression without psychotic features (HCC) Active Problems:   Alcohol use disorder, severe, dependence (HCC)   Cocaine use disorder, moderate, dependence (HCC)   Tobacco  use disorder  Long Term Goal(s): Improvement in symptoms so as ready for  discharge Improvement in symptoms so as ready for discharge   Short Term Goals: Ability to identify changes in lifestyle to reduce recurrence of condition will improve Ability to verbalize feelings will improve Ability to demonstrate self-control will improve Ability to identify and develop effective coping behaviors will improve Compliance with prescribed medications will improve Ability to identify triggers associated with substance abuse/mental health issues will improve Ability to identify changes in lifestyle to reduce recurrence of condition will improve Ability to identify and develop effective coping behaviors will improve Ability to identify triggers associated with substance abuse/mental health issues will improve     Medication Management: Evaluate patient's response, side effects, and tolerance of medication regimen.  Therapeutic Interventions: 1 to 1 sessions, Unit Group sessions and Medication administration.  Evaluation of Outcomes: Adequate for Discharge   RN Treatment Plan for Primary Diagnosis: Severe recurrent major depression without psychotic features (HCC) Long Term Goal(s): Knowledge of disease and therapeutic regimen to maintain health will improve  Short Term Goals: Ability to verbalize feelings will improve, Ability to disclose and discuss suicidal ideas, Ability to identify and develop effective coping behaviors will improve and Compliance with prescribed medications will improve  Medication Management: RN will administer medications as ordered by provider, will assess and evaluate patient's response and provide education to patient for prescribed medication. RN will report any adverse and/or side effects to prescribing provider.  Therapeutic Interventions: 1 on 1 counseling sessions, Psychoeducation, Medication administration, Evaluate responses to treatment, Monitor vital signs and CBGs as ordered, Perform/monitor CIWA, COWS, AIMS and Fall Risk screenings as  ordered, Perform wound care treatments as ordered.  Evaluation of Outcomes: Adequate for Discharge   LCSW Treatment Plan for Primary Diagnosis: Severe recurrent major depression without psychotic features (HCC) Long Term Goal(s): Safe transition to appropriate next level of care at discharge, Engage patient in therapeutic group addressing interpersonal concerns.  Short Term Goals: Engage patient in aftercare planning with referrals and resources, Increase social support, Increase ability to appropriately verbalize feelings, Increase emotional regulation, Facilitate acceptance of mental health diagnosis and concerns, Facilitate patient progression through stages of change regarding substance use diagnoses and concerns, Identify triggers associated with mental health/substance abuse issues and Increase skills for wellness and recovery  Therapeutic Interventions: Assess for all discharge needs, 1 to 1 time with Social worker, Explore available resources and support systems, Assess for adequacy in community support network, Educate family and significant other(s) on suicide prevention, Complete Psychosocial Assessment, Interpersonal group therapy.  Evaluation of Outcomes: Adequate for Discharge   Progress in Treatment: Attending groups: Yes. Participating in groups: Yes. Taking medication as prescribed: Yes. Toleration medication: No. Family/Significant other contact made: Yes, individual(s) contacted:  fiance' Patient understands diagnosis: Yes. Discussing patient identified problems/goals with staff: Yes. Medical problems stabilized or resolved: Yes. Denies suicidal/homicidal ideation: Yes. Issues/concerns per patient self-inventory: No. Other: n/a  New problem(s) identified: None identified at this time.   New Short Term/Long Term Goal(s):None identified at this time.   Discharge Plan or Barriers: Patient will discharge back home to live with his wife and follow-up with [facility] substance  abuse intensive outpatient program for outpatient services with therapy and medication management.   Reason for Continuation of Hospitalization: Anticipated discharge date: 08/06/2016  Estimated Length of Stay: Anticipated discharge date: 08/06/2016  Attendees: Patient:Sean Hoffman 08/06/2016 11:10 AM  Physician:  08/06/2016 11:10 AM  Nursing: Hulan Amato, RN 08/06/2016 11:10 AM  RN Care  Manager: 08/06/2016 11:10 AM  Social Worker: Fredrich BirksAmaris G. Garnette CzechSampson MSW, LCSWA 08/06/2016 11:10 AM  Recreational Therapist:  08/06/2016 11:10 AM  Other:  08/06/2016 11:10 AM  Other:  08/06/2016 11:10 AM  Other: 08/06/2016 11:10 AM    Scribe for Treatment Team: Arelia LongestAmaris G Lulani Bour, LCSWA 08/06/2016 11:13 AM

## 2016-08-06 NOTE — Progress Notes (Signed)
Pt is alert and oriented x 4, respirations even and unlabored, gait steady, no acute distress noted. Denies having any pain or discomfort. Pt requested to have the head of his bed raised a bit. Request granted. Pt is pleased. Denies SI/HI/AVH, depression and anxiety stating "I feel pretty good. Had a good day to day." No unsafe behaviors noted. Remains on q15 minute observation checks for safety. Will continue to monitor.

## 2016-08-06 NOTE — Progress Notes (Signed)
  [facility] :  Will you be returning to the same living situation after discharge:  Yes,  patient will be living with his fiance' At discharge, do you have transportation home?: Yes,  fiance' or daughter Do you have the ability to pay for your medications: Yes,  patient has financial support from disability and fiance'  Release of information consent forms completed and in the chart;  Patient's signature needed at discharge.  Patient to Follow up at: Follow-up Information    [facility] Follow up on 08/07/2016.   Why:  Please arrive early to your appointment with [facility] on 08/12/2016 at 7:15am. Please bring ID, current medications, and discharge summary. Call Unk PintoHarvey Bryant Peer Support Specialist with [facility] [phone] with any questions. Contact information: [address] [place] [address] [phone]        Kandyce RudKERNODLE JR, GEORGE W, MD Follow up.   Specialty:  Rheumatology Contact information: [address] [facility] [place] [id number] [phone]           Next level of care provider [facility] Link:no  Safety Planning and Suicide Prevention discussed: Yes,  with patient and fiance'  Have you used any form of tobacco in the last 30 days? (Cigarettes, Smokeless Tobacco, Cigars, and/or Pipes): Yes  Has patient been referred to the Quitline?: Patient refused referral  Patient has been referred for addiction treatment: Yes, Patient will follow-up with [facility] substance abuse intensive outpatient program.   Fredrich BirksAmaris G. Garnette CzechSampson MSW, LCSWA 08/06/2016, 11:09 AM

## 2016-08-06 NOTE — Care Management (Signed)
Patient was in a good mood, we talked about the service offered by Pastoral Care & Counseling Department, Advance Directive and Mel[place] Bankeret cetera. He was pleasant and decided that he will let me know if he wants anything.

## 2016-08-06 NOTE — [facility] Suicide Risk Assessment (Signed)
[facility]   Principal Problem: Severe recurrent major depression without psychotic features [facility](HCC) Discharge Diagnoses:  Patient Active Problem List   Diagnosis Date Noted  . Alcohol use disorder, severe, dependence (HCC) [F10.20] 08/05/2016  . Cocaine use disorder, moderate, dependence (HCC) [F14.20] 08/05/2016  . Tobacco use disorder [F17.200] 08/05/2016  . Severe recurrent major depression without psychotic features (HCC) [F33.2] 08/04/2016      Psychiatric Specialty Exam: ROS  Blood pressure (!) 141/92, pulse 63, temperature 98 F (36.7 C), temperature source Oral, resp. rate 18, height 6' 0.44" (1.84 m), weight 77.1 kg (170 lb), SpO2 96 %.Body mass index is 22.78 kg/m.                                                       Mental Status Per Nursing Assessment::   On Admission:  Self-harm thoughts  Demographic Factors:  Male  Loss Factors: Loss of significant relationship  Historical Factors: Family history of mental illness or substance abuse and Impulsivity  Risk Reduction Factors:   Sense of responsibility to family, Living with another person, especially a relative and Positive social support  Continued Clinical Symptoms:  Depression:   Comorbid alcohol abuse/dependence Alcohol/Substance Abuse/Dependencies Previous Psychiatric Diagnoses and Treatments  Cognitive Features That Contribute To Risk:  None    Suicide Risk:  Minimal: No identifiable suicidal ideation.  Patients presenting with no risk factors but with morbid ruminations; may be classified as minimal risk based on the severity of the depressive symptoms    Sean FootmanHernandez-Gonzalez,  Rutilio Yellowhair, MD 08/06/2016, 10:25 AM

## 2016-08-06 NOTE — Plan of Care (Signed)
Problem: Coping: Goal: Ability to demonstrate self-control will improve Outcome: Progressing Continue to work on coping skills    

## 2016-08-06 NOTE — Plan of Care (Signed)
Problem: Safety: Goal: Ability to remain free from injury will improve Outcome: Progressing Pt has remained free from injury.  Problem: Pain Managment: Goal: General experience of comfort will improve Outcome: Progressing Pt denies any pain or discomfort.  Problem: Activity: Goal: Interest or engagement in leisure activities will improve Outcome: Progressing Pt will continue to appropriately interact with peers.  Problem: Education: Goal: Ability to make informed decisions regarding treatment will improve Outcome: Progressing Pt will continue to make informed decisions regarding treatment.

## 2016-10-09 ENCOUNTER — Emergency Department
Admission: EM | Admit: 2016-10-09 | Discharge: 2016-10-09 | Disposition: A | Discharge status: Home / Self Care | Payer: Medicaid Other | Attending: Emergency Medicine | Admitting: Emergency Medicine

## 2016-10-09 ENCOUNTER — Encounter: Payer: Self-pay | Admitting: Emergency Medicine

## 2016-10-09 DIAGNOSIS — R45851 Suicidal ideations: Secondary | ICD-10-CM

## 2016-10-09 DIAGNOSIS — F1994 Other psychoactive substance use, unspecified with psychoactive substance-induced mood disorder: Secondary | ICD-10-CM

## 2016-10-09 DIAGNOSIS — I10 Essential (primary) hypertension: Secondary | ICD-10-CM | POA: Diagnosis not present

## 2016-10-09 DIAGNOSIS — F17210 Nicotine dependence, cigarettes, uncomplicated: Secondary | ICD-10-CM | POA: Diagnosis not present

## 2016-10-09 DIAGNOSIS — Z79899 Other long term (current) drug therapy: Secondary | ICD-10-CM | POA: Diagnosis not present

## 2016-10-09 DIAGNOSIS — F1010 Alcohol abuse, uncomplicated: Secondary | ICD-10-CM | POA: Insufficient documentation

## 2016-10-09 DIAGNOSIS — F1020 Alcohol dependence, uncomplicated: Secondary | ICD-10-CM | POA: Diagnosis present

## 2016-10-09 DIAGNOSIS — F329 Major depressive disorder, single episode, unspecified: Secondary | ICD-10-CM | POA: Insufficient documentation

## 2016-10-09 DIAGNOSIS — F332 Major depressive disorder, recurrent severe without psychotic features: Secondary | ICD-10-CM | POA: Diagnosis present

## 2016-10-09 DIAGNOSIS — F1420 Cocaine dependence, uncomplicated: Secondary | ICD-10-CM | POA: Diagnosis present

## 2016-10-09 HISTORY — DX: Essential (primary) hypertension: I10

## 2016-10-09 HISTORY — DX: Bipolar disorder, unspecified: F31.9

## 2016-10-09 HISTORY — DX: Schizophrenia, unspecified: F20.9

## 2016-10-09 LAB — COMPREHENSIVE METABOLIC PANEL
ALBUMIN: 4.3 g/dL (ref 3.5–5.0)
ALT: 17 U/L (ref 17–63)
ANION GAP: 12 (ref 5–15)
AST: 28 U/L (ref 15–41)
Alkaline Phosphatase: 60 U/L (ref 38–126)
BUN: 7 mg/dL (ref 6–20)
CHLORIDE: 105 mmol/L (ref 101–111)
CO2: 22 mmol/L (ref 22–32)
Calcium: 9.4 mg/dL (ref 8.9–10.3)
Creatinine, Ser: 0.95 mg/dL (ref 0.61–1.24)
GFR calc non Af Amer: >60 mL/min (ref >60)
GLUCOSE: 120 mg/dL — AB (ref 65–99)
Potassium: 3.5 mmol/L (ref 3.5–5.1)
SODIUM: 139 mmol/L (ref 135–145)
Total Bilirubin: 0.1 mg/dL — ABNORMAL LOW (ref 0.3–1.2)
Total Protein: 8 g/dL (ref 6.5–8.1)

## 2016-10-09 LAB — LIPASE, BLOOD: Lipase: 25 U/L (ref 11–51)

## 2016-10-09 LAB — URINE DRUG SCREEN, QUALITATIVE (ARMC ONLY)
AMPHETAMINES, UR SCREEN: NOT DETECTED
BARBITURATES, UR SCREEN: NOT DETECTED
Benzodiazepine, Ur Scrn: NOT DETECTED
COCAINE METABOLITE, UR ~~LOC~~: POSITIVE — AB
Cannabinoid 50 Ng, Ur ~~LOC~~: NOT DETECTED
MDMA (ECSTASY) UR SCREEN: NOT DETECTED
METHADONE SCREEN, URINE: NOT DETECTED
Opiate, Ur Screen: NOT DETECTED
Phencyclidine (PCP) Ur S: NOT DETECTED
TRICYCLIC, UR SCREEN: NOT DETECTED

## 2016-10-09 LAB — CBC
HCT: 43 % (ref 40.0–52.0)
HEMOGLOBIN: 14.1 g/dL (ref 13.0–18.0)
MCH: 28 pg (ref 26.0–34.0)
MCHC: 32.7 g/dL (ref 32.0–36.0)
MCV: 85.6 fL (ref 80.0–100.0)
Platelets: 299 10*3/uL (ref 150–440)
RBC: 5.03 MIL/uL (ref 4.40–5.90)
RDW: 14.2 % (ref 11.5–14.5)
WBC: 13.4 10*3/uL — AB (ref 3.8–10.6)

## 2016-10-09 LAB — ETHANOL: Alcohol, Ethyl (B): 231 mg/dL — ABNORMAL HIGH (ref <5)

## 2016-10-09 LAB — ACETAMINOPHEN LEVEL

## 2016-10-09 LAB — SALICYLATE LEVEL

## 2016-10-09 NOTE — BH Assessment (Signed)
Assessment Note  Sean Hoffman is an 51 y.o. male [place] [facility] [place] [place] [facility] [place] calling requesting to be transported to the ED for suicidal ideations.  According to the IVC paperwork, patient has been having thoughts of SI by taking pills.  Upon assessment, pt states that he does not remember making suicidal statements but says that "if the police said I made those statements then they must be true".    Pt presents as intoxicated and unable to completely engage during interview.  Pt BAC 231 and his UDS was positive for cocaine.    Diagnosis: Depression, Substance Abuse  Past Medical History:  Past Medical History:  Diagnosis Date  . Bipolar affective (HCC)   . Hypertension   . Medical history non-contributory   . Schizophrenia [facility](HCC)     Past Surgical History:  Procedure Laterality Date  . ABOVE KNEE LEG AMPUTATION Right     Family History: No family history on file.  Social History:  reports that he has been smoking Cigarettes and E-cigarettes.  He has been smoking about 2.00 packs per day. He has never used smokeless tobacco. He reports that he drinks alcohol. He reports that he uses drugs, including Cocaine and Marijuana.  Additional Social History:  Alcohol / Drug Use Pain Medications: See PTA Prescriptions: See PTA Over the Counter: See PTA History of alcohol / drug use?: Yes Longest period of sobriety (when/how long): Pt reports he can't remember Negative Consequences of Use: Personal relationships, Work / Programmer, multimediachool, Surveyor, quantityinancial Withdrawal Symptoms: Blackouts Substance #1 Name of Substance 1: Cocaine 1 - Amount (size/oz): can't remember 1 - Last Use / Amount: 10/09/16 Substance #2 Name of Substance 2: Alcohol 2 - Last Use / Amount: 10/09/16  CIWA: CIWA-Ar BP: 109/74 Pulse Rate: 83 COWS:    Allergies: No Known Allergies  Home Medications:  (Not in a hospital admission)  OB/GYN Status:  No LMP for male patient.  General Assessment Data [facility] Assessment: In system Is this a Tele or Face-to-Face Assessment?: Face-to-Face Is this an Initial Assessment or a Re-assessment for this encounter?: Initial Assessment Marital status: Single Maiden name: n/a Is patient pregnant?: No Pregnancy Status: No Living Arrangements: Alone Can pt return to current living arrangement?: Yes Admission Status: Involuntary Is patient capable of signing voluntary admission?: Yes Referral Source: Self/Family/Friend Insurance type: [employer]  Medical Screening Exam [facility] Walk-in ONLY) Medical Exam completed: Yes  Crisis Care Plan Living Arrangements: Alone Legal Guardian: Other: (self) Name of Psychiatrist: Reports none Name of Therapist: Reports none  Education Status Is patient currently in school?: No Current Grade: n/a Highest grade of school patient has completed: some college Name of school: na Contact person: na  Risk to self with the past 6 months Suicidal Ideation: Yes-Currently Present Has patient been a risk to self within the past 6 months prior to admission? : Yes Suicidal Intent: No Has patient had any suicidal intent within the past 6 months prior to admission? : Yes Is patient at risk for suicide?: No Suicidal Plan?: No Has patient had any suicidal plan within the past 6 months prior to admission? : No Access to Means: No What has been your use of drugs/alcohol within the last 12 months?: alcohol and cocaine Previous Attempts/Gestures: Yes How many times?: 5 Other Self Harm Risks: n/a Triggers for Past Attempts: None known Intentional Self Injurious Behavior: None Family Suicide History: No Recent stressful life event(s): Conflict (Comment) Persecutory voices/beliefs?: Yes Depression:  Yes Depression Symptoms: Insomnia, Isolating, Guilt, Loss of interest in usual pleasures, Feeling worthless/self pity, Feeling angry/irritable Substance abuse history and/or treatment for substance  abuse?: Yes Suicide prevention information given to non-admitted patients: Not applicable  Risk to Others within the past 6 months Homicidal Ideation: No Does patient have any lifetime risk of violence toward others beyond the six months prior to admission? : No Thoughts of Harm to Others: No Current Homicidal Intent: No Current Homicidal Plan: No Access to Homicidal Means: No Identified Victim: None identified History of harm to others?: No Assessment of Violence: None Noted Violent Behavior Description: None identified Does patient have access to weapons?: No Criminal Charges Pending?: No Does patient have a court date: No Is patient on probation?: No  Psychosis Hallucinations: None noted Delusions: None noted  Mental Status Report Appearance/Hygiene: In scrubs Eye Contact: Fair Motor Activity: Unsteady Speech: Incoherent Level of Consciousness: Sedated, Drowsy Mood: Depressed Affect: Depressed Anxiety Level: None Thought Processes: Unable to Assess Judgement: Partial Orientation: Person, Place Obsessive Compulsive Thoughts/Behaviors: None  Cognitive Functioning Concentration: Poor Memory: Unable to Assess IQ: Average Insight: Unable to Assess Impulse Control: Unable to Assess Appetite: Fair Weight Loss: 0 Weight Gain: 0 Sleep: Decreased Vegetative Symptoms: None  ADLScreening [facility] Assessment Services) Patient's cognitive ability adequate to safely complete daily activities?: Yes Patient able to express need for assistance with ADLs?: Yes Independently performs ADLs?: Yes (appropriate for developmental age)  Prior Inpatient Therapy Prior Inpatient Therapy: Yes Prior Therapy Dates: 07/2016 Prior Therapy Facilty/Provider(s): [facility] Reason for Treatment: Depression  Prior Outpatient Therapy Prior Outpatient Therapy: Yes Prior Therapy Dates: current Prior Therapy Facilty/Provider(s): [facility] Reason for Treatment: substance abuse Does patient have an ACCT  team?: No Does patient have Intensive In-House Services?  : No Does patient have [facility] services? : No Does patient have P4CC services?: No  ADL Screening (condition at time of admission) Patient's cognitive ability adequate to safely complete daily activities?: Yes Patient able to express need for assistance with ADLs?: Yes Independently performs ADLs?: Yes (appropriate for developmental age)       Abuse/Neglect Assessment (Assessment to be complete while patient is alone) Physical Abuse: Denies Verbal Abuse: Denies Sexual Abuse: Denies Exploitation of patient/patient's resources: Denies Self-Neglect: Denies Values / Beliefs Cultural Requests During Hospitalization: None Spiritual Requests During Hospitalization: None Consults Spiritual Care Consult Needed: No Social Work Consult Needed: No Merchant navy officer (For Healthcare) Does Patient Have a Medical Advance Directive?: No (Pt unable to answer) Would patient like information on creating a medical advance directive?: No - Patient declined    Additional Information 1:1 In Past 12 Months?: No CIRT Risk: No Elopement Risk: No Does patient have medical clearance?: No     Disposition:  Disposition Initial Assessment Completed for this Encounter: Yes Disposition of Patient: Other dispositions Other disposition(s): Other (Comment) (Pending Psych MD consult)  On Site Evaluation by:   Reviewed with Physician:    [place] 10/09/2016 6:43 AM

## 2016-10-09 NOTE — Discharge Instructions (Signed)
Please follow-up with [facility]. Please return for any further problems.

## 2016-10-09 NOTE — Progress Notes (Signed)
LCSW consulted with ED RN and was provided a Peer support number  Ms Elizebeth BrookingCotton [phone]   Arrie SenateClaudine Prachi Oftedahl LCSW [phone]

## 2016-10-09 NOTE — ED Notes (Signed)
Pt states he had too much to drink and nothing to eat and is sick on his stomach because of it. Pt given 2 pack of saltine crackers.

## 2016-10-09 NOTE — ED Provider Notes (Signed)
[facility] Emergency Department Provider Note   ____________________________________________   First MD Initiated Contact with Patient 10/09/16 [phone]     (approximate)  I have reviewed the triage vital signs and the nursing notes.   HISTORY  Chief Complaint Psychiatric Evaluation    HPI Sean Hoffman is a 51 y.o. male who comes into the hospital today with possible suicidal ideation. The patient reports that the police told him he was trying to commit suicide. He reports that he was passing out for no reason and he said that the police would not lie on him. The patient was drinking tonight but he said that it was so much that he cannot even say. The patient does have a history of suicidal ideation he reports that this was about 1-2 years ago but he was not hospitalized because he was not taken seriously. He reports that he was too scared to do it did not do anything. He was also using some cocaine he reports about a day and a half ago. The patient was brought in by police for further evaluation. He was brought in under involuntary commitment.   Past Medical History:  Diagnosis Date  . Bipolar affective (HCC)   . Hypertension   . Medical history non-contributory   . Schizophrenia [facility](HCC)     Patient Active Problem List   Diagnosis Date Noted  . HTN (hypertension) 08/06/2016  . Alcohol use disorder, severe, dependence (HCC) 08/05/2016  . Cocaine use disorder, moderate, dependence (HCC) 08/05/2016  . Tobacco use disorder 08/05/2016  . Severe recurrent major depression without psychotic features (HCC) 08/04/2016    Past Surgical History:  Procedure Laterality Date  . ABOVE KNEE LEG AMPUTATION Right     Prior to Admission medications   Medication Sig Start Date End Date Taking? Authorizing Provider  amLODipine (NORVASC) 2.5 MG tablet Take 1 tablet (2.5 mg total) by mouth daily. 08/06/16  Yes Jimmy FootmanAndrea Hernandez-Gonzalez, MD  citalopram (CELEXA) 20 MG  tablet Take 1 tablet (20 mg total) by mouth daily. 08/07/16  Yes Jimmy FootmanAndrea Hernandez-Gonzalez, MD  traZODone (DESYREL) 100 MG tablet Take 1 tablet (100 mg total) by mouth at bedtime as needed for sleep. 08/06/16  Yes Jimmy FootmanAndrea Hernandez-Gonzalez, MD    Allergies Patient has no known allergies.  No family history on file.  Social History Social History  Substance Use Topics  . Smoking status: Current Every Day Smoker    Packs/day: 2.00    Types: Cigarettes, E-cigarettes  . Smokeless tobacco: Never Used  . Alcohol use Yes     Comment: "If I drink I'm just gonna drink till I pass out. I binge drink."    Review of Systems Constitutional: No fever/chills Eyes: No visual changes. ENT: No sore throat. Cardiovascular: Denies chest pain. Respiratory: Denies shortness of breath. Gastrointestinal: No abdominal pain.  No nausea, no vomiting.  No diarrhea.  No constipation. Genitourinary: Negative for dysuria. Musculoskeletal: Negative for back pain. Skin: Negative for rash. Neurological: Negative for headaches, focal weakness or numbness. Psych: Suicidal ideation  10-point ROS otherwise negative.  ____________________________________________   PHYSICAL EXAM:  VITAL SIGNS: ED Triage Vitals  Enc Vitals Group     BP 10/09/16 0215 (!) 164/117     Pulse Rate 10/09/16 0215 94     Resp 10/09/16 0215 18     Temp 10/09/16 0215 98.2 F (36.8 C)     Temp Source 10/09/16 0215 Oral     SpO2 10/09/16 0215 99 %  Weight 10/09/16 0216 187 lb (84.8 kg)     Height 10/09/16 0216 6' (1.829 m)     Head Circumference --      Peak Flow --      Pain Score --      Pain Loc --      Pain Edu? --      Excl. in GC? --     Constitutional: Alert and oriented. Well appearing and in Mild to moderate distress. Eyes: Conjunctivae are normal. PERRL. EOMI. Head: Atraumatic. Nose: No congestion/rhinnorhea. Mouth/Throat: Mucous membranes are moist.  Oropharynx non-erythematous. Cardiovascular: Normal rate,  regular rhythm. Grossly normal heart sounds.  Good peripheral circulation. Respiratory: Normal respiratory effort.  No retractions. Lungs CTAB. Gastrointestinal: Mid upper abdomen pain to palpation. No distention. Positive bowel sounds Musculoskeletal: No lower extremity tenderness nor edema.   Neurologic:  Normal speech and language.  Skin:  Skin is warm, dry and intact.  Psychiatric: Mood and affect are normal. l.  ____________________________________________   LABS (all labs ordered are listed, but only abnormal results are displayed)  Labs Reviewed  COMPREHENSIVE METABOLIC PANEL - Abnormal; Notable for the following:       Result Value   Glucose, Bld 120 (*)    Total Bilirubin 0.1 (*)    All other components within normal limits  ETHANOL - Abnormal; Notable for the following:    Alcohol, Ethyl (B) 231 (*)    All other components within normal limits  ACETAMINOPHEN LEVEL - Abnormal; Notable for the following:    Acetaminophen (Tylenol), Serum <10 (*)    All other components within normal limits  CBC - Abnormal; Notable for the following:    WBC 13.4 (*)    All other components within normal limits  URINE DRUG SCREEN, QUALITATIVE ([facility] ONLY) - Abnormal; Notable for the following:    Cocaine Metabolite,Ur  POSITIVE (*)    All other components within normal limits  SALICYLATE LEVEL  LIPASE, BLOOD   ____________________________________________  EKG  none ____________________________________________  RADIOLOGY  none ____________________________________________   PROCEDURES  Procedure(s) performed: None  Procedures  Critical Care performed: No  ____________________________________________   INITIAL IMPRESSION / ASSESSMENT AND PLAN / ED COURSE  Pertinent labs & imaging results that were available during my care of the patient were reviewed by me and considered in my medical decision making (see chart for details).  This is a 51 year old male who comes into  the hospital today for possible suicidal ideation. The patient reports that he does not remember because he was passing out so much. He reports that he just recently restarted drinking after not drinking for multiple months. He reports though that he believes to police might be accurate that he was threatening to kill himself. I will have the patient evaluated by psych.      ____________________________________________   FINAL CLINICAL IMPRESSION(S) / ED DIAGNOSES  Final diagnoses:  Suicidal ideation      NEW MEDICATIONS STARTED DURING THIS VISIT:  New Prescriptions   No medications on file     Note:  This document was prepared using Dragon voice recognition software and may include unintentional dictation errors.    Rebecka Apley, MD 10/09/16 [phone]

## 2016-10-09 NOTE — ED Notes (Addendum)
Pt assisted to bathroom with 2 assist. Pt very unsteady in bathroom, security and assistance called. Wheelchair brought in and pt assisted to wheelchair with 3 assist. Pt wheeled back to bed and placed back in bed. Pt told not to get out of bed due to unsteadiness without calling.

## 2016-10-09 NOTE — ED Notes (Signed)
Patient walked to lobby by this RN to meet family.

## 2016-10-09 NOTE — ED Notes (Signed)
Pt given breakfast tray. Pt ate and has returning to lying position.

## 2016-10-09 NOTE — Consult Note (Signed)
[place] Psychiatry Consult   Reason for Consult:  Consult for 51 year old man with a history of alcohol abuse and depression Referring Physician:  Darl Householder Patient Identification: Sean Hoffman MRN:  [id number] Principal Diagnosis: Substance induced mood disorder ([place]) Diagnosis:   Patient Active Problem List   Diagnosis Date Noted  . Substance induced mood disorder ([place]) [F19.94] 10/09/2016  . HTN (hypertension) [I10] 08/06/2016  . Alcohol use disorder, severe, dependence ([place]) [F10.20] 08/05/2016  . Cocaine use disorder, moderate, dependence ([place]) [F14.20] 08/05/2016  . Tobacco use disorder [F17.200] 08/05/2016  . Severe recurrent major depression without psychotic features ([place]) [F33.2] 08/04/2016    Total Time spent with patient: 1 hour  Subjective:   Zeyad Delaguila is a 51 y.o. male patient admitted with "I had a blackout spell ".  HPI:  Patient interviewed. Chart reviewed. 51 year old man with a history of alcohol abuse cocaine abuse and depression. Came into the emergency room intoxicated stating he was having suicidal ideation. Patient reinterviewed today after sobering up. He tells me he no longer has any thoughts of wanting to hurt himself. He says he had been mostly staying away from alcohol but admits that he had a binge in the last couple days and also had some cocaine. He had been staying at his sister's house and had been off his medicine as well.  Medical history: Outside of alcohol abuse no major ongoing medical problems.  Social history: Recently had been staying with his sister. Hopes to get back into staying on his own 7. Has follow-up treatment at [facility].  Substance abuse history: History of alcohol abuse and cocaine abuse. History of suicidal threats while intoxicated.  Past Psychiatric History: Past history of threats of suicide no actual suicide attempts. Has been on trazodone and Celexa. Was in the hospital before.  Risk to Self: Suicidal Ideation: Yes-Currently  Present Suicidal Intent: No Is patient at risk for suicide?: No Suicidal Plan?: No Access to Means: No What has been your use of drugs/alcohol within the last 12 months?: alcohol and cocaine How many times?: 5 Other Self Harm Risks: n/a Triggers for Past Attempts: None known Intentional Self Injurious Behavior: None Risk to Others: Homicidal Ideation: No Thoughts of Harm to Others: No Current Homicidal Intent: No Current Homicidal Plan: No Access to Homicidal Means: No Identified Victim: None identified History of harm to others?: No Assessment of Violence: None Noted Violent Behavior Description: None identified Does patient have access to weapons?: No Criminal Charges Pending?: No Does patient have a court date: No Prior Inpatient Therapy: Prior Inpatient Therapy: Yes Prior Therapy Dates: 07/2016 Prior Therapy Facilty/Provider([facility]): [facility] Reason for Treatment: Depression Prior Outpatient Therapy: Prior Outpatient Therapy: Yes Prior Therapy Dates: current Prior Therapy Facilty/Provider(s): [facility] Reason for Treatment: substance abuse Does patient have an ACCT team?: No Does patient have Intensive In-House Services?  : No Does patient have [facility] services? : No Does patient have P4CC services?: No  Past Medical History:  Past Medical History:  Diagnosis Date  . Bipolar affective ([place])   . Hypertension   . Medical history non-contributory   . Schizophrenia [facility])     Past Surgical History:  Procedure Laterality Date  . ABOVE KNEE LEG AMPUTATION Right    Family History: No family history on file. Family Psychiatric  History: None identified Social History:  History  Alcohol Use  . Yes    Comment: "If I drink I'm just gonna drink till I pass out. I binge drink."     History  Drug Use  . Types: Cocaine, Marijuana    Social History   Social History  . Marital status: Legally Separated    Spouse name: N/A  . Number of children: N/A  . Years of education: N/A    Social History Main Topics  . Smoking status: Current Every Day Smoker    Packs/day: 2.00    Types: Cigarettes, E-cigarettes  . Smokeless tobacco: Never Used  . Alcohol use Yes     Comment: "If I drink I'm just gonna drink till I pass out. I binge drink."  . Drug use: Yes    Types: Cocaine, Marijuana  . Sexual activity: Yes    Birth control/ protection: None   Other Topics Concern  . None   Social History Narrative  . None   Additional Social History:    Allergies:  No Known Allergies  Labs:  Results for orders placed or performed during the hospital encounter of 10/09/16 (from the past 48 hour(s))  Comprehensive metabolic panel     Status: Abnormal   Collection Time: 10/09/16  2:18 AM  Result Value Ref Range   Sodium 139 135 - 145 mmol/L   Potassium 3.5 3.5 - 5.1 mmol/L   Chloride 105 101 - 111 mmol/L   CO2 22 22 - 32 mmol/L   Glucose, Bld 120 (H) 65 - 99 mg/dL   BUN 7 6 - 20 mg/dL   Creatinine, Ser 0.95 0.61 - 1.24 mg/dL   Calcium 9.4 8.9 - 10.3 mg/dL   Total Protein 8.0 6.5 - 8.1 g/dL   Albumin 4.3 3.5 - 5.0 g/dL   AST 28 15 - 41 U/L   ALT 17 17 - 63 U/L   Alkaline Phosphatase 60 38 - 126 U/L   Total Bilirubin 0.1 (L) 0.3 - 1.2 mg/dL   GFR calc non Af Amer >60 >60 mL/min   GFR calc Af Amer >60 >60 mL/min    Comment: (NOTE) The eGFR has been calculated using the CKD EPI equation. This calculation has not been validated in all clinical situations. eGFR's persistently <60 mL/min signify possible Chronic Kidney Disease.    Anion gap 12 5 - 15  Ethanol     Status: Abnormal   Collection Time: 10/09/16  2:18 AM  Result Value Ref Range   Alcohol, Ethyl (B) 231 (H) <5 mg/dL    Comment:        LOWEST DETECTABLE LIMIT FOR SERUM ALCOHOL IS 5 mg/dL FOR MEDICAL PURPOSES ONLY   Salicylate level     Status: None   Collection Time: 10/09/16  2:18 AM  Result Value Ref Range   Salicylate Lvl <7.3 2.8 - 30.0 mg/dL  Acetaminophen level     Status: Abnormal    Collection Time: 10/09/16  2:18 AM  Result Value Ref Range   Acetaminophen (Tylenol), Serum <10 (L) 10 - 30 ug/mL    Comment:        THERAPEUTIC CONCENTRATIONS VARY SIGNIFICANTLY. A RANGE OF 10-30 ug/mL MAY BE AN EFFECTIVE CONCENTRATION FOR MANY PATIENTS. HOWEVER, SOME ARE BEST TREATED AT CONCENTRATIONS OUTSIDE THIS RANGE. ACETAMINOPHEN CONCENTRATIONS >150 ug/mL AT 4 HOURS AFTER INGESTION AND >50 ug/mL AT 12 HOURS AFTER INGESTION ARE OFTEN ASSOCIATED WITH TOXIC REACTIONS.   cbc     Status: Abnormal   Collection Time: 10/09/16  2:18 AM  Result Value Ref Range   WBC 13.4 (H) 3.8 - 10.6 K/uL   RBC 5.03 4.40 - 5.90 MIL/uL   Hemoglobin 14.1 13.0 - 18.0 g/dL  HCT 43.0 40.0 - 52.0 %   MCV 85.6 80.0 - 100.0 fL   MCH 28.0 26.0 - 34.0 pg   MCHC 32.7 32.0 - 36.0 g/dL   RDW 14.2 11.5 - 14.5 %   Platelets 299 150 - 440 K/uL  Lipase, blood     Status: None   Collection Time: 10/09/16  2:18 AM  Result Value Ref Range   Lipase 25 11 - 51 U/L  Urine Drug Screen, Qualitative     Status: Abnormal   Collection Time: 10/09/16  2:32 AM  Result Value Ref Range   Tricyclic, Ur Screen NONE DETECTED NONE DETECTED   Amphetamines, Ur Screen NONE DETECTED NONE DETECTED   MDMA (Ecstasy)Ur Screen NONE DETECTED NONE DETECTED   Cocaine Metabolite,Ur [place] POSITIVE (A) NONE DETECTED   Opiate, Ur Screen NONE DETECTED NONE DETECTED   Phencyclidine (PCP) Ur S NONE DETECTED NONE DETECTED   Cannabinoid 50 Ng, Ur [place] NONE DETECTED NONE DETECTED   Barbiturates, Ur Screen NONE DETECTED NONE DETECTED   Benzodiazepine, Ur Scrn NONE DETECTED NONE DETECTED   Methadone Scn, Ur NONE DETECTED NONE DETECTED    Comment: (NOTE) 938  Tricyclics, urine               Cutoff 1000 ng/mL 200  Amphetamines, urine             Cutoff 1000 ng/mL 300  MDMA (Ecstasy), urine           Cutoff 500 ng/mL 400  Cocaine Metabolite, urine       Cutoff 300 ng/mL 500  Opiate, urine                   Cutoff 300 ng/mL 600  Phencyclidine  (PCP), urine      Cutoff 25 ng/mL 700  Cannabinoid, urine              Cutoff 50 ng/mL 800  Barbiturates, urine             Cutoff 200 ng/mL 900  Benzodiazepine, urine           Cutoff 200 ng/mL 1000 Methadone, urine                Cutoff 300 ng/mL 1100 1200 The urine drug screen provides only a preliminary, unconfirmed 1300 analytical test result and should not be used for non-medical 1400 purposes. Clinical consideration and professional judgment should 1500 be applied to any positive drug screen result due to possible 1600 interfering substances. A more specific alternate chemical method 1700 must be used in order to obtain a confirmed analytical result.  1800 Gas chromato graphy / mass spectrometry (GC/MS) is the preferred 1900 confirmatory method.     No current facility-administered medications for this encounter.    Current Outpatient Prescriptions  Medication Sig Dispense Refill  . amLODipine (NORVASC) 2.5 MG tablet Take 1 tablet (2.5 mg total) by mouth daily. 30 tablet 0  . citalopram (CELEXA) 20 MG tablet Take 1 tablet (20 mg total) by mouth daily. 30 tablet 0  . traZODone (DESYREL) 100 MG tablet Take 1 tablet (100 mg total) by mouth at bedtime as needed for sleep. 30 tablet 0    Musculoskeletal: Strength & Muscle Tone: within normal limits Gait & Station: normal Patient leans: N/A  Psychiatric Specialty Exam: Physical Exam  Nursing note and vitals reviewed. Constitutional: He appears well-developed and well-nourished.  HENT:  Head: Normocephalic and atraumatic.  Eyes: Conjunctivae are normal. Pupils are equal, round,  and reactive to light.  Neck: Normal range of motion.  Cardiovascular: Regular rhythm and normal heart sounds.   Respiratory: Effort normal. No respiratory distress.  GI: Soft.  Musculoskeletal: Normal range of motion.  Neurological: He is alert.  Skin: Skin is warm and dry.  Psychiatric: Judgment normal. His affect is blunt. His speech is  delayed. He is slowed. Thought content is not paranoid. He expresses no homicidal and no suicidal ideation. He exhibits abnormal recent memory.    Review of Systems  Constitutional: Negative.   HENT: Negative.   Eyes: Negative.   Respiratory: Negative.   Cardiovascular: Negative.   Gastrointestinal: Negative.   Musculoskeletal: Negative.   Skin: Negative.   Neurological: Negative.   Psychiatric/Behavioral: Positive for memory loss and substance abuse. Negative for depression, hallucinations and suicidal ideas. The patient is not nervous/anxious and does not have insomnia.     Blood pressure (!) 133/96, pulse 70, temperature 98.4 F (36.9 C), temperature source Oral, resp. rate 20, height 6' (1.829 m), weight 84.8 kg (187 lb), SpO2 99 %.Body mass index is 25.36 kg/m.  General Appearance: Casual  Eye Contact:  Fair  Speech:  Clear and Coherent  Volume:  Decreased  Mood:  Euthymic  Affect:  Congruent  Thought Process:  Goal Directed  Orientation:  Full (Time, Place, and Person)  Thought Content:  Logical  Suicidal Thoughts:  No  Homicidal Thoughts:  No  Memory:  Immediate;   Good Recent;   Fair Remote;   Fair  Judgement:  Fair  Insight:  Fair  Psychomotor Activity:  Decreased  Concentration:  Concentration: Fair  Recall:  [employer] of Knowledge:  Fair  Language:  Fair  Akathisia:  No  Handed:  Right  AIMS (if indicated):     Assets:  Desire for Improvement Financial Resources/Insurance Housing Physical Health Resilience  ADL's:  Intact  Cognition:  WNL  Sleep:        Treatment Plan Summary: Plan Patient is now sobered up. Calm. Appropriate behavior. Denies any suicidal thoughts. He does not need any further hospital level treatment. Case reviewed with emergency room physician and TTS. Discontinue IVC. Follow-up with [facility].  Disposition: Patient does not meet criteria for psychiatric inpatient admission. Supportive therapy provided about ongoing stressors.  Alethia Berthold, MD 10/09/2016 3:44 PM

## 2016-10-09 NOTE — ED Notes (Signed)
Upon assessment pt states "I don't know" when asked why he is here. Pt states he has just had a lot going on but was un able to finish the thought. Pt was unable to give a clear answer when asked if he had suicidal thoughts, pt states "I don't know, honestly I wish I was dead but I just can't do it." When asked what he meant but "just can't do it," pt was unable to explain. Pt was cooperative with questions. Pt reports inability to sleep only sleeping in 30 min increment.

## 2016-10-09 NOTE — ED Triage Notes (Signed)
Pt ambulatory to triage via [employer] under IVC, pt states asked [employer] to bring him to hospital. Pt tearful in triage. Per IVC paper, pt called police and stated he wanted to hurt himself by taking pills.

## 2017-01-12 ENCOUNTER — Emergency Department
Admission: EM | Admit: 2017-01-12 | Discharge: 2017-01-12 | Disposition: A | Discharge status: Home / Self Care | Payer: Medicaid Other | Attending: Emergency Medicine | Admitting: Emergency Medicine

## 2017-01-12 ENCOUNTER — Emergency Department: Payer: Medicaid Other

## 2017-01-12 DIAGNOSIS — F10120 Alcohol abuse with intoxication, uncomplicated: Secondary | ICD-10-CM | POA: Insufficient documentation

## 2017-01-12 DIAGNOSIS — Z79899 Other long term (current) drug therapy: Secondary | ICD-10-CM | POA: Insufficient documentation

## 2017-01-12 DIAGNOSIS — F1910 Other psychoactive substance abuse, uncomplicated: Secondary | ICD-10-CM | POA: Insufficient documentation

## 2017-01-12 DIAGNOSIS — I10 Essential (primary) hypertension: Secondary | ICD-10-CM | POA: Insufficient documentation

## 2017-01-12 DIAGNOSIS — F10920 Alcohol use, unspecified with intoxication, uncomplicated: Secondary | ICD-10-CM

## 2017-01-12 DIAGNOSIS — F17210 Nicotine dependence, cigarettes, uncomplicated: Secondary | ICD-10-CM | POA: Diagnosis not present

## 2017-01-12 DIAGNOSIS — R4182 Altered mental status, unspecified: Secondary | ICD-10-CM | POA: Diagnosis present

## 2017-01-12 LAB — URINE DRUG SCREEN, QUALITATIVE (ARMC ONLY)
Amphetamines, Ur Screen: NOT DETECTED
BARBITURATES, UR SCREEN: NOT DETECTED
BENZODIAZEPINE, UR SCRN: NOT DETECTED
Cannabinoid 50 Ng, Ur ~~LOC~~: NOT DETECTED
Cocaine Metabolite,Ur ~~LOC~~: POSITIVE — AB
MDMA (Ecstasy)Ur Screen: NOT DETECTED
METHADONE SCREEN, URINE: NOT DETECTED
OPIATE, UR SCREEN: NOT DETECTED
Phencyclidine (PCP) Ur S: NOT DETECTED
TRICYCLIC, UR SCREEN: NOT DETECTED

## 2017-01-12 LAB — COMPREHENSIVE METABOLIC PANEL
ALT: 35 U/L (ref 17–63)
ANION GAP: 11 (ref 5–15)
AST: 70 U/L — ABNORMAL HIGH (ref 15–41)
Albumin: 4.5 g/dL (ref 3.5–5.0)
Alkaline Phosphatase: 56 U/L (ref 38–126)
BILIRUBIN TOTAL: 0.5 mg/dL (ref 0.3–1.2)
BUN: 10 mg/dL (ref 6–20)
CO2: 24 mmol/L (ref 22–32)
Calcium: 9.2 mg/dL (ref 8.9–10.3)
Chloride: 110 mmol/L (ref 101–111)
Creatinine, Ser: 0.96 mg/dL (ref 0.61–1.24)
GFR calc Af Amer: >60 mL/min (ref >60)
Glucose, Bld: 109 mg/dL — ABNORMAL HIGH (ref 65–99)
POTASSIUM: 3.8 mmol/L (ref 3.5–5.1)
Sodium: 145 mmol/L (ref 135–145)
TOTAL PROTEIN: 8 g/dL (ref 6.5–8.1)

## 2017-01-12 LAB — CBC
HEMATOCRIT: 43.3 % (ref 40.0–52.0)
Hemoglobin: 14.3 g/dL (ref 13.0–18.0)
MCH: 27 pg (ref 26.0–34.0)
MCHC: 33 g/dL (ref 32.0–36.0)
MCV: 81.8 fL (ref 80.0–100.0)
Platelets: 232 10*3/uL (ref 150–440)
RBC: 5.3 MIL/uL (ref 4.40–5.90)
RDW: 13.8 % (ref 11.5–14.5)
WBC: 9.8 10*3/uL (ref 3.8–10.6)

## 2017-01-12 LAB — ACETAMINOPHEN LEVEL

## 2017-01-12 LAB — ETHANOL: ALCOHOL ETHYL (B): 218 mg/dL — AB (ref <5)

## 2017-01-12 LAB — SALICYLATE LEVEL: Salicylate Lvl: <7 mg/dL (ref 2.8–30.0)

## 2017-01-12 MED ORDER — SODIUM CHLORIDE 0.9 % IV BOLUS (SEPSIS)
1000.0000 mL | Freq: Once | INTRAVENOUS | Status: AC
  Administered 2017-01-12: 1000 mL via INTRAVENOUS

## 2017-01-12 MED ORDER — NALOXONE HCL 2 MG/2ML IJ SOSY
0.4000 mg | PREFILLED_SYRINGE | Freq: Once | INTRAMUSCULAR | Status: AC
Start: 2017-01-12 — End: 2017-01-12
  Administered 2017-01-12: 0.4 mg via INTRAVENOUS
  Filled 2017-01-12: qty 2

## 2017-01-12 MED ORDER — ONDANSETRON HCL 4 MG/2ML IJ SOLN
4.0000 mg | Freq: Once | INTRAMUSCULAR | Status: AC
  Administered 2017-01-12: 4 mg via INTRAVENOUS
  Filled 2017-01-12: qty 2

## 2017-01-12 NOTE — ED Notes (Signed)
[employer] officer monitoring pt at this time

## 2017-01-12 NOTE — ED Provider Notes (Signed)
[facility] Emergency Department Provider Note   ____________________________________________   First MD Initiated Contact with Patient 01/12/17 0028     (approximate)  I have reviewed the triage vital signs and the nursing notes.   HISTORY  Chief Complaint Altered Mental Status  The history is limited by the patient's mental status.  HPI Sean Hoffman is a 51 y.o. male who comes into the hospital todaywith some altered mental status. The police officer who appears that the patient reports that he was in a holding cell at jail. He was spitting and acting as though he wanted to vomit. The police officer reports that he was having some intermittent unresponsiveness. The patient admits to drinking alcohol and doing cocaine. The police officer says that he was drooling out of his mouth and they were unsure what was wrong with him. The patient is acting very nervous and scared as staff is evaluating him. He will not answer my questions. He does speak with fluent speech and apologizes to staff. He also says that he does have a place to live but again does not answer any questions regarding his visit to the hospital.   Past Medical History:  Diagnosis Date  . Bipolar affective (HCC)   . Hypertension   . Medical history non-contributory   . Schizophrenia [facility])     Patient Active Problem List   Diagnosis Date Noted  . Substance induced mood disorder (HCC) 10/09/2016  . HTN (hypertension) 08/06/2016  . Alcohol use disorder, severe, dependence (HCC) 08/05/2016  . Cocaine use disorder, moderate, dependence (HCC) 08/05/2016  . Tobacco use disorder 08/05/2016  . Severe recurrent major depression without psychotic features (HCC) 08/04/2016    Past Surgical History:  Procedure Laterality Date  . ABOVE KNEE LEG AMPUTATION Right     Prior to Admission medications   Medication Sig Start Date End Date Taking? Authorizing Provider  amLODipine (NORVASC) 2.5 MG tablet  Take 1 tablet (2.5 mg total) by mouth daily. 08/06/16   Jimmy Footman, MD  citalopram (CELEXA) 20 MG tablet Take 1 tablet (20 mg total) by mouth daily. 08/07/16   Jimmy Footman, MD  traZODone (DESYREL) 100 MG tablet Take 1 tablet (100 mg total) by mouth at bedtime as needed for sleep. 08/06/16   Jimmy Footman, MD    Allergies Patient has no known allergies.  No family history on file.  Social History Social History  Substance Use Topics  . Smoking status: Current Every Day Smoker    Packs/day: 2.00    Types: Cigarettes, E-cigarettes  . Smokeless tobacco: Never Used  . Alcohol use Yes     Comment: "If I drink I'm just gonna drink till I pass out. I binge drink."    Review of Systems  Unable to assess due to patient's altered mental status.  ____________________________________________   PHYSICAL EXAM:  VITAL SIGNS: ED Triage Vitals  Enc Vitals Group     BP 01/12/17 0046 (!) 147/93     Pulse Rate 01/12/17 0046 92     Resp 01/12/17 0046 17     Temp 01/12/17 0046 97.9 F (36.6 C)     Temp Source 01/12/17 0046 Oral     SpO2 01/12/17 0046 98 %     Weight 01/12/17 0034 190 lb (86.2 kg)     Height 01/12/17 0038 6' (1.829 m)     Head Circumference --      Peak Flow --      Pain Score 01/12/17 0033 0  Pain Loc --      Pain Edu? --      Excl. in GC? --     Constitutional: Alert and disoriented. Anxious appearing and in moderate distress. Eyes: Conjunctivae are normal. PERRL. EOMI. Head: Atraumatic. Nose: No congestion/rhinnorhea. Mouth/Throat: Mucous membranes are moist.  Oropharynx non-erythematous. Neck: No cervical spine tenderness to palpation. Cardiovascular: Normal rate, regular rhythm. Grossly normal heart sounds.  Good peripheral circulation. Respiratory: Normal respiratory effort.  No retractions. Lungs CTAB. Gastrointestinal: Soft and nontender. No distention. Positive bowel sounds Musculoskeletal: No lower extremity  tenderness nor edema.   Neurologic:  Normal speech and language. Patient will not operate with the neurologic exam. He does spit up occasionally and stare off but then he takes a deep breath and apologizes. Skin:  Skin is warm, dry and intact. Abrasion noted to left scapula Psychiatric: Mood and affect are normal.   ____________________________________________   LABS (all labs ordered are listed, but only abnormal results are displayed)  Labs Reviewed  COMPREHENSIVE METABOLIC PANEL - Abnormal; Notable for the following:       Result Value   Glucose, Bld 109 (*)    AST 70 (*)    All other components within normal limits  ETHANOL - Abnormal; Notable for the following:    Alcohol, Ethyl (B) 218 (*)    All other components within normal limits  ACETAMINOPHEN LEVEL - Abnormal; Notable for the following:    Acetaminophen (Tylenol), Serum <10 (*)    All other components within normal limits  URINE DRUG SCREEN, QUALITATIVE ([facility] ONLY) - Abnormal; Notable for the following:    Cocaine Metabolite,Ur [place] POSITIVE (*)    All other components within normal limits  CBC  SALICYLATE LEVEL   ____________________________________________  EKG  ED ECG REPORT I, Rebecka Apley, the attending physician, personally viewed and interpreted this ECG.   Date: 01/12/2017  EKG Time: 0036  Rate: 95  Rhythm: normal sinus rhythm  Axis: normal  Intervals:Incomplete right bundle branch block  ST&T Change: none  ____________________________________________  RADIOLOGY  CT head and cervical spine ____________________________________________   PROCEDURES  Procedure(s) performed: None  Procedures  Critical Care performed: No  ____________________________________________   INITIAL IMPRESSION / ASSESSMENT AND PLAN / ED COURSE  Pertinent labs & imaging results that were available during my care of the patient were reviewed by me and considered in my medical decision making (see chart for  details).  This is a 51 year old male with a history of bipolar disorder and schizophrenia who comes into the hospital today with altered mental status. The patient was in police custody and he was acting strange. He reports admits to drinking as well as doing cocaine. I am unsure if the patient's symptoms are due to his cocaine use or anything else. I will check the patient's blood work as well as give him a liter of normal saline. I will do a CT scan of the patient's head as well as cervical spine to ensure that he didn't fall and injure himself. I will reassess the patient once I received all of the results.  Clinical Course as of Jan 13 551  Tue Jan 12, 2017  0212 1. No evidence of traumatic intracranial injury or fracture. 2. No evidence of fracture or subluxation along the cervical spine. 3. Mild cortical volume loss and scattered small vessel ischemic microangiopathy. 4. 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 5. Mild degenerative change at the mid to lower cervical spine.   CT  Head Wo Contrast [AW]    Clinical Course User Index [AW] Rebecka Apley, MD   The patient was sleeping comfortably in the emergency department. His initial alcohol was 280 and his cocaine was positive. The remaining lab work is unremarkable. I feel that the patient unresponsive episodes is from his alcohol intoxication. The police did monitor the patient as well as the hospital staff. When it was determined that the patient could safely be discharged back to jail the patient was discharged with askating that he was medically cleared.  ____________________________________________   FINAL CLINICAL IMPRESSION(S) / ED DIAGNOSES  Final diagnoses:  Alcoholic intoxication without complication (HCC)  Substance abuse      NEW MEDICATIONS STARTED DURING THIS VISIT:  Discharge Medication List as of 01/12/2017  5:09 AM       Note:  This document was prepared using Dragon voice recognition  software and may include unintentional dictation errors.    Rebecka Apley, MD 01/12/17 [id number]

## 2017-01-12 NOTE — ED Triage Notes (Addendum)
Pt bib EMS from police custody. Per EMS/[employer] officer, police was called out for domestic dispute, pt was arrested and on way to jail when pt began having seizure like activity, "foaming at mouth" and episodes of unresponsiveness.  Per [employer], pt admitted to using "crack" 4 hours PTA.  At this time, pt alert, has head control and answering questions.  Upon arrival to ED, pt had 2 episodes where he became limp and drooled, pt awoke from each episode, appearing fearful and asking where he was.  At the time of triage note, pt was comfortable, calm and cooperative.

## 2017-08-21 ENCOUNTER — Other Ambulatory Visit: Payer: Self-pay

## 2017-08-21 ENCOUNTER — Emergency Department
Admission: EM | Admit: 2017-08-21 | Discharge: 2017-08-22 | Disposition: A | Discharge status: Home / Self Care | Payer: Medicaid Other | Attending: Emergency Medicine | Admitting: Emergency Medicine

## 2017-08-21 DIAGNOSIS — Y907 Blood alcohol level of 200-239 mg/100 ml: Secondary | ICD-10-CM | POA: Diagnosis not present

## 2017-08-21 DIAGNOSIS — I10 Essential (primary) hypertension: Secondary | ICD-10-CM | POA: Insufficient documentation

## 2017-08-21 DIAGNOSIS — F1410 Cocaine abuse, uncomplicated: Secondary | ICD-10-CM | POA: Insufficient documentation

## 2017-08-21 DIAGNOSIS — F39 Unspecified mood [affective] disorder: Secondary | ICD-10-CM | POA: Insufficient documentation

## 2017-08-21 DIAGNOSIS — F329 Major depressive disorder, single episode, unspecified: Secondary | ICD-10-CM | POA: Insufficient documentation

## 2017-08-21 DIAGNOSIS — F17210 Nicotine dependence, cigarettes, uncomplicated: Secondary | ICD-10-CM | POA: Insufficient documentation

## 2017-08-21 DIAGNOSIS — Z046 Encounter for general psychiatric examination, requested by authority: Secondary | ICD-10-CM | POA: Diagnosis not present

## 2017-08-21 DIAGNOSIS — F10220 Alcohol dependence with intoxication, uncomplicated: Secondary | ICD-10-CM | POA: Insufficient documentation

## 2017-08-21 DIAGNOSIS — R451 Restlessness and agitation: Secondary | ICD-10-CM | POA: Diagnosis present

## 2017-08-21 DIAGNOSIS — Z79899 Other long term (current) drug therapy: Secondary | ICD-10-CM | POA: Diagnosis not present

## 2017-08-21 DIAGNOSIS — R45851 Suicidal ideations: Secondary | ICD-10-CM | POA: Insufficient documentation

## 2017-08-21 DIAGNOSIS — F10920 Alcohol use, unspecified with intoxication, uncomplicated: Secondary | ICD-10-CM

## 2017-08-21 LAB — CBC
HEMATOCRIT: 49.6 % (ref 40.0–52.0)
HEMOGLOBIN: 16.2 g/dL (ref 13.0–18.0)
MCH: 27.8 pg (ref 26.0–34.0)
MCHC: 32.6 g/dL (ref 32.0–36.0)
MCV: 85.1 fL (ref 80.0–100.0)
Platelets: 262 10*3/uL (ref 150–440)
RBC: 5.83 MIL/uL (ref 4.40–5.90)
RDW: 14.4 % (ref 11.5–14.5)
WBC: 10.6 10*3/uL (ref 3.8–10.6)

## 2017-08-21 LAB — COMPREHENSIVE METABOLIC PANEL
ALT: 19 U/L (ref 17–63)
ANION GAP: 13 (ref 5–15)
AST: 36 U/L (ref 15–41)
Albumin: 4.3 g/dL (ref 3.5–5.0)
Alkaline Phosphatase: 68 U/L (ref 38–126)
BILIRUBIN TOTAL: 0.5 mg/dL (ref 0.3–1.2)
BUN: 7 mg/dL (ref 6–20)
CO2: 22 mmol/L (ref 22–32)
Calcium: 9.8 mg/dL (ref 8.9–10.3)
Chloride: 107 mmol/L (ref 101–111)
Creatinine, Ser: 0.88 mg/dL (ref 0.61–1.24)
GFR calc Af Amer: >60 mL/min (ref >60)
Glucose, Bld: 102 mg/dL — ABNORMAL HIGH (ref 65–99)
POTASSIUM: 4 mmol/L (ref 3.5–5.1)
Sodium: 142 mmol/L (ref 135–145)
TOTAL PROTEIN: 8.4 g/dL — AB (ref 6.5–8.1)

## 2017-08-21 LAB — URINE DRUG SCREEN, QUALITATIVE (ARMC ONLY)
Amphetamines, Ur Screen: NOT DETECTED
Barbiturates, Ur Screen: NOT DETECTED
Benzodiazepine, Ur Scrn: NOT DETECTED
COCAINE METABOLITE, UR ~~LOC~~: POSITIVE — AB
Cannabinoid 50 Ng, Ur ~~LOC~~: NOT DETECTED
MDMA (ECSTASY) UR SCREEN: NOT DETECTED
Methadone Scn, Ur: NOT DETECTED
OPIATE, UR SCREEN: NOT DETECTED
Phencyclidine (PCP) Ur S: NOT DETECTED
TRICYCLIC, UR SCREEN: NOT DETECTED

## 2017-08-21 LAB — SALICYLATE LEVEL: Salicylate Lvl: <7 mg/dL (ref 2.8–30.0)

## 2017-08-21 LAB — ETHANOL: ALCOHOL ETHYL (B): 217 mg/dL — AB (ref <10)

## 2017-08-21 LAB — ACETAMINOPHEN LEVEL

## 2017-08-21 MED ORDER — AMLODIPINE BESYLATE 5 MG PO TABS: 2.5000 mg | ORAL_TABLET | Freq: Every day | ORAL | Status: DC

## 2017-08-21 MED ORDER — TRAZODONE HCL 100 MG PO TABS: 100.0000 mg | ORAL_TABLET | Freq: Every evening | ORAL | Status: DC | PRN

## 2017-08-21 MED ORDER — CITALOPRAM HYDROBROMIDE 20 MG PO TABS: 20.0000 mg | ORAL_TABLET | Freq: Every day | ORAL | Status: DC

## 2017-08-21 NOTE — ED Provider Notes (Signed)
[facility] Emergency Department Provider Note   ____________________________________________   I have reviewed the triage vital signs and the nursing notes.   HISTORY  Chief Complaint Patient without complaint - here under IVC  History limited by: Not Limited   HPI Sean Hoffman is a 51 y.o. male who presents to the emergency department today under IVC because of others concern for his safety  DURATION:depression for years SEVERITY: threatened suicide to girlfriend CONTEXT: patient states he had been drinking today. Got in an argument with his girlfriend. Apparently threatened suicide and girlfriend called police MODIFYING FACTORS: none ASSOCIATED SYMPTOMS: denies medical complants  Per medical record review patient has a history of schizophrenia, substance induced mood disorder.  Past Medical History:  Diagnosis Date  . Bipolar affective (HCC)   . Hypertension   . Medical history non-contributory   . Schizophrenia [facility](HCC)     Patient Active Problem List   Diagnosis Date Noted  . Substance induced mood disorder (HCC) 10/09/2016  . HTN (hypertension) 08/06/2016  . Alcohol use disorder, severe, dependence (HCC) 08/05/2016  . Cocaine use disorder, moderate, dependence (HCC) 08/05/2016  . Tobacco use disorder 08/05/2016  . Severe recurrent major depression without psychotic features (HCC) 08/04/2016    Past Surgical History:  Procedure Laterality Date  . ABOVE KNEE LEG AMPUTATION Right     Prior to Admission medications   Medication Sig Start Date End Date Taking? Authorizing Provider  amLODipine (NORVASC) 2.5 MG tablet Take 1 tablet (2.5 mg total) by mouth daily. 08/06/16   Jimmy FootmanHernandez-Gonzalez, Andrea, MD  citalopram (CELEXA) 20 MG tablet Take 1 tablet (20 mg total) by mouth daily. 08/07/16   Jimmy FootmanHernandez-Gonzalez, Andrea, MD  traZODone (DESYREL) 100 MG tablet Take 1 tablet (100 mg total) by mouth at bedtime as needed for sleep. 08/06/16    Jimmy FootmanHernandez-Gonzalez, Andrea, MD    Allergies Patient has no known allergies.  No family history on file.  Social History Social History   Tobacco Use  . Smoking status: Current Every Day Smoker    Packs/day: 2.00    Types: Cigarettes, E-cigarettes  . Smokeless tobacco: Never Used  Substance Use Topics  . Alcohol use: Yes    Comment: "If I drink I'm just gonna drink till I pass out. I binge drink."  . Drug use: Yes    Types: Cocaine, Marijuana    Review of Systems Constitutional: No fever/chills Eyes: No visual changes. ENT: No sore throat. Cardiovascular: Denies chest pain. Respiratory: Denies shortness of breath. Gastrointestinal: No abdominal pain.  No nausea, no vomiting.  No diarrhea.   Genitourinary: Negative for dysuria. Musculoskeletal: Negative for back pain. Skin: Negative for rash. Neurological: Negative for headaches, focal weakness or numbness.  ____________________________________________   PHYSICAL EXAM:  VITAL SIGNS: ED Triage Vitals [08/21/17 1937]  Enc Vitals Group     BP (!) 145/119     Pulse Rate 90     Resp 20     Temp 98.2 F (36.8 C)     Temp Source Oral     SpO2 97 %    Constitutional: Alert and oriented.  Eyes: Conjunctivae are normal.  ENT   Head: Normocephalic and atraumatic.   Nose: No congestion/rhinnorhea.   Mouth/Throat: Mucous membranes are moist.   Neck: No stridor. Hematological/Lymphatic/Immunilogical: No cervical lymphadenopathy. Cardiovascular: Normal rate, regular rhythm.  No murmurs, rubs, or gallops. Respiratory: Normal respiratory effort without tachypnea nor retractions. Breath sounds are clear and equal bilaterally. No wheezes/rales/rhonchi. Gastrointestinal: Soft and non tender. No  rebound. No guarding.  Genitourinary: Deferred Musculoskeletal: Normal range of motion in all extremities. No lower extremity edema. Neurologic:  Normal speech and language. No gross focal neurologic deficits are  appreciated.  Skin:  Skin is warm, dry and intact. No rash noted. Psychiatric: Depressed. Denies SI at this time.  ____________________________________________    LABS (pertinent positives/negatives)  Etoh 217 UDS positive for cocaine CMP glu 102, k 4.0 Salicylate and tylenol level negative CBC wnl ____________________________________________   EKG  None  ____________________________________________    RADIOLOGY  None  ____________________________________________   PROCEDURES  Procedures  ____________________________________________   INITIAL IMPRESSION / ASSESSMENT AND PLAN / ED COURSE  Pertinent labs & imaging results that were available during my care of the patient were reviewed by me and considered in my medical decision making (see chart for details).  Patient presents to the emergency department today under IVC because of threatening suicide after a fight with his girlfriend.  On exam patient does appear slightly depressed.  He however denies any SI to myself.  Given patient's history though given IVC paperwork and concern for threatening suicide earlier will [facility]   ____________________________________________   FINAL CLINICAL IMPRESSION(S) / ED DIAGNOSES  Alcohol abuse Depression  Note: This dictation was prepared with Dragon dictation. Any transcriptional errors that result from this process are unintentional     Phineas SemenGoodman, Manu Rubey, MD 08/21/17 2340

## 2017-08-21 NOTE — ED Triage Notes (Addendum)
Per [place] [employer] (Officer [place]) patient had IVC papers taken out on patient because he made statements that he wanted to harm himself.  PD reports that patient reported having a bad day, reports artificial leg was broken and had other issues.  When PD attempted to take to him patient told them that they couldn't stop him from what he was going to do.  In triage patient is cooperative, smiling and joking with officers.   Patient reports he has not taken his blood pressure in about 5 days.

## 2017-08-21 NOTE — ED Notes (Signed)
Pt has BTK amputation and will keep this with him, one bag of clothes

## 2017-08-21 NOTE — ED Notes (Signed)
BEHAVIORAL HEALTH ROUNDING  Patient sleeping: No.  Patient alert and oriented: yes  Behavior appropriate: Yes. ; If no, describe:  Nutrition and fluids offered: Yes  Toileting and hygiene offered: Yes  Sitter present: not applicable, Q 15 min safety rounds and observation.  Law enforcement present: Yes ODS  

## 2017-08-21 NOTE — ED Notes (Signed)

## 2017-08-22 NOTE — ED Notes (Signed)
BEHAVIORAL HEALTH ROUNDING Patient sleeping: Yes.   Patient alert and oriented: not applicable SLEEPING Behavior appropriate: Yes.  ; If no, describe: SLEEPING Nutrition and fluids offered: No SLEEPING Toileting and hygiene offered: NoSLEEPING Sitter present: not applicable, Q 15 min safety rounds and observation. Law enforcement present: Yes ODS 

## 2017-08-22 NOTE — ED Provider Notes (Signed)
-----------------------------------------   3:20 AM on 08/22/2017 -----------------------------------------  [facility] Dr[facility] Hermelinda MedicusAlvarado who will resend patient's IVC.  Deemed him psychiatrically stable for discharge home with outpatient follow-up.  Strict return precautions given.  Patient verbalizes understanding and agrees with plan of care.   Irean HongSung, Anjanae Woehrle J, MD 08/22/17 [phone]

## 2017-08-22 NOTE — ED Notes (Signed)
BEHAVIORAL HEALTH ROUNDING  Patient sleeping: No.  Patient alert and oriented: yes  Behavior appropriate: Yes. ; If no, describe:  Nutrition and fluids offered: Yes  Toileting and hygiene offered: Yes  Sitter present: not applicable, Q 15 min safety rounds and observation.  Law enforcement present: Yes ODS  

## 2017-08-22 NOTE — ED Notes (Signed)
[facility] by April, RN. Camera placed in room and process explained to pt who verbalizes understanding.

## 2017-08-22 NOTE — ED Notes (Signed)
BEHAVIORAL HEALTH ROUNDING  Patient sleeping: No.  Patient alert and oriented: yes  Behavior appropriate: Yes. ; If no, describe:  Nutrition and fluids offered: Yes  Toileting and hygiene offered: Yes  Sitter present: not applicable, Q 15 min safety rounds and observation.  Law enforcement present: Yes ODS  Pt awakened and informed he is for discharge. Pt does not think he can find a way home. Will call [employer] to give pt a ride home as they brought him in under IVC.

## 2017-08-22 NOTE — Discharge Instructions (Signed)
Continue all medicines as directed by your doctor.  Return to the ER for worsening symptoms, feelings of hurting yourself or others, or other concerns. °

## 2018-06-15 ENCOUNTER — Encounter: Payer: Self-pay | Admitting: Emergency Medicine

## 2018-06-15 ENCOUNTER — Other Ambulatory Visit: Payer: Self-pay

## 2018-06-15 ENCOUNTER — Emergency Department
Admission: EM | Admit: 2018-06-15 | Discharge: 2018-06-16 | Disposition: A | Discharge status: Home / Self Care | Payer: Medicaid Other | Attending: Emergency Medicine | Admitting: Emergency Medicine

## 2018-06-15 DIAGNOSIS — F332 Major depressive disorder, recurrent severe without psychotic features: Secondary | ICD-10-CM | POA: Diagnosis not present

## 2018-06-15 DIAGNOSIS — R451 Restlessness and agitation: Secondary | ICD-10-CM | POA: Diagnosis present

## 2018-06-15 DIAGNOSIS — F17210 Nicotine dependence, cigarettes, uncomplicated: Secondary | ICD-10-CM | POA: Insufficient documentation

## 2018-06-15 DIAGNOSIS — F1020 Alcohol dependence, uncomplicated: Secondary | ICD-10-CM | POA: Diagnosis present

## 2018-06-15 DIAGNOSIS — F1420 Cocaine dependence, uncomplicated: Secondary | ICD-10-CM | POA: Diagnosis present

## 2018-06-15 DIAGNOSIS — R4689 Other symptoms and signs involving appearance and behavior: Secondary | ICD-10-CM

## 2018-06-15 DIAGNOSIS — Z79899 Other long term (current) drug therapy: Secondary | ICD-10-CM | POA: Insufficient documentation

## 2018-06-15 DIAGNOSIS — F1994 Other psychoactive substance use, unspecified with psychoactive substance-induced mood disorder: Secondary | ICD-10-CM | POA: Diagnosis present

## 2018-06-15 DIAGNOSIS — I10 Essential (primary) hypertension: Secondary | ICD-10-CM | POA: Insufficient documentation

## 2018-06-15 LAB — CBC
HCT: 41.5 % (ref 40.0–52.0)
HEMOGLOBIN: 14.1 g/dL (ref 13.0–18.0)
MCH: 29.1 pg (ref 26.0–34.0)
MCHC: 34.1 g/dL (ref 32.0–36.0)
MCV: 85.5 fL (ref 80.0–100.0)
Platelets: 252 10*3/uL (ref 150–440)
RBC: 4.86 MIL/uL (ref 4.40–5.90)
RDW: 14.2 % (ref 11.5–14.5)
WBC: 9.1 10*3/uL (ref 3.8–10.6)

## 2018-06-15 LAB — URINE DRUG SCREEN, QUALITATIVE (ARMC ONLY)
AMPHETAMINES, UR SCREEN: NOT DETECTED
Barbiturates, Ur Screen: NOT DETECTED
Benzodiazepine, Ur Scrn: NOT DETECTED
Cannabinoid 50 Ng, Ur ~~LOC~~: NOT DETECTED
Cocaine Metabolite,Ur ~~LOC~~: POSITIVE — AB
MDMA (ECSTASY) UR SCREEN: NOT DETECTED
METHADONE SCREEN, URINE: NOT DETECTED
OPIATE, UR SCREEN: NOT DETECTED
PHENCYCLIDINE (PCP) UR S: NOT DETECTED
Tricyclic, Ur Screen: NOT DETECTED

## 2018-06-15 LAB — COMPREHENSIVE METABOLIC PANEL
ALBUMIN: 4.4 g/dL (ref 3.5–5.0)
ALT: 14 U/L (ref 0–44)
AST: 26 U/L (ref 15–41)
Alkaline Phosphatase: 57 U/L (ref 38–126)
Anion gap: 11 (ref 5–15)
BUN: 9 mg/dL (ref 6–20)
CALCIUM: 9.3 mg/dL (ref 8.9–10.3)
CHLORIDE: 106 mmol/L (ref 98–111)
CO2: 24 mmol/L (ref 22–32)
CREATININE: 0.93 mg/dL (ref 0.61–1.24)
GFR calc Af Amer: >60 mL/min (ref >60)
GFR calc non Af Amer: >60 mL/min (ref >60)
GLUCOSE: 110 mg/dL — AB (ref 70–99)
Potassium: 3.2 mmol/L — ABNORMAL LOW (ref 3.5–5.1)
SODIUM: 141 mmol/L (ref 135–145)
Total Bilirubin: 0.6 mg/dL (ref 0.3–1.2)
Total Protein: 7.5 g/dL (ref 6.5–8.1)

## 2018-06-15 LAB — ACETAMINOPHEN LEVEL: Acetaminophen (Tylenol), Serum: <10 ug/mL — ABNORMAL LOW (ref 10–30)

## 2018-06-15 LAB — ETHANOL: Alcohol, Ethyl (B): 221 mg/dL — ABNORMAL HIGH (ref <10)

## 2018-06-15 LAB — SALICYLATE LEVEL: Salicylate Lvl: <7 mg/dL (ref 2.8–30.0)

## 2018-06-15 MED ORDER — ZIPRASIDONE MESYLATE 20 MG IM SOLR
20.0000 mg | Freq: Once | INTRAMUSCULAR | Status: AC
  Administered 2018-06-15: 20 mg via INTRAMUSCULAR

## 2018-06-15 MED ORDER — LORAZEPAM 2 MG/ML IJ SOLN
2.0000 mg | Freq: Once | INTRAMUSCULAR | Status: AC
  Administered 2018-06-15: 2 mg via INTRAMUSCULAR

## 2018-06-15 MED ORDER — ZIPRASIDONE MESYLATE 20 MG IM SOLR
INTRAMUSCULAR | Status: AC
  Administered 2018-06-15: 20 mg via INTRAMUSCULAR
  Filled 2018-06-15: qty 20

## 2018-06-15 MED ORDER — LORAZEPAM 2 MG/ML IJ SOLN
INTRAMUSCULAR | Status: AC
  Administered 2018-06-15: 2 mg via INTRAMUSCULAR
  Filled 2018-06-15: qty 1

## 2018-06-15 NOTE — ED Notes (Signed)
Pt has a plastic bag with a silver cell phone, a gray lighter, a cell phone charger and a green pocket knife in with his belongings. Virgilio Belling and Rozell Searing is aware of this bag.

## 2018-06-15 NOTE — ED Notes (Signed)
Patient taken in wheelchair to room 23 with handcuffs still in place. MD aware of patient. [employer] escorting patient.

## 2018-06-15 NOTE — ED Notes (Signed)
Pt. Currently resting in bed. 

## 2018-06-15 NOTE — ED Notes (Signed)
Pt. Urinated in bed, pt. Changed and linens changed.  Pt. Woke and requested urinal after being cleaned up.  Pt. Proceeded to urinate outside urinal and did not request help when offered.  Pt. Cleaned and linens changed again.   Pt. Was given medication around 5 pm for loud and disturbing behavior.

## 2018-06-15 NOTE — ED Notes (Signed)
PT IVC PENDING PSYCH CONSULT. 

## 2018-06-15 NOTE — ED Triage Notes (Signed)
Patient arrived to ED with [employer] under IVC in handcuffs. Per police, they were called to a parking lot where they found several individuals intoxicated. Upon being told to leave, patient reportedly walked into traffic and laid down in the road. Police decided to take papers out for patient's safety. Patient agitated in triage, refusing to answer questions until he "talks to his lawyer". Patient refusing to have temperature taken or blood work done.

## 2018-06-15 NOTE — ED Provider Notes (Signed)
[facility] Emergency Department Provider Note  Time seen: 5:12 PM  I have reviewed the triage vital signs and the nursing notes.   HISTORY  Chief Complaint Aggressive Behavior    HPI Sean Hoffman is a 52 y.o. male with a past medical history of bipolar, schizophrenia, hypertension, substance use presents to the emergency department under IVC.  According to report patient was arguing in a parking lot, police were called to the scene at which time the patient went and laid down in the street.  Police were concerned for patient safety so they took out an IVC.  Upon arrival patient was extremely agitated in the emergency department yelling refusing blood work refusing to dress out.  Patient required IM sedation upon arrival to the emergency department.   Past Medical History:  Diagnosis Date  . Bipolar affective (HCC)   . Hypertension   . Medical history non-contributory   . Schizophrenia [facility])     Patient Active Problem List   Diagnosis Date Noted  . Substance induced mood disorder (HCC) 10/09/2016  . HTN (hypertension) 08/06/2016  . Alcohol use disorder, severe, dependence (HCC) 08/05/2016  . Cocaine use disorder, moderate, dependence (HCC) 08/05/2016  . Tobacco use disorder 08/05/2016  . Severe recurrent major depression without psychotic features (HCC) 08/04/2016    Past Surgical History:  Procedure Laterality Date  . ABOVE KNEE LEG AMPUTATION Right     Prior to Admission medications   Medication Sig Start Date End Date Taking? Authorizing Provider  amLODipine (NORVASC) 2.5 MG tablet Take 1 tablet (2.5 mg total) by mouth daily. 08/06/16   Jimmy Footman, MD  citalopram (CELEXA) 20 MG tablet Take 1 tablet (20 mg total) by mouth daily. 08/07/16   Jimmy Footman, MD  traZODone (DESYREL) 100 MG tablet Take 1 tablet (100 mg total) by mouth at bedtime as needed for sleep. 08/06/16   Jimmy Footman, MD    No Known  Allergies  No family history on file.  Social History Social History   Tobacco Use  . Smoking status: Current Every Day Smoker    Packs/day: 2.00    Types: Cigarettes, E-cigarettes  . Smokeless tobacco: Never Used  Substance Use Topics  . Alcohol use: Yes    Comment: "If I drink I'm just gonna drink till I pass out. I binge drink."  . Drug use: Yes    Types: Cocaine, Marijuana    Review of Systems Constitutional: Negative for fever. Cardiovascular: Negative for chest pain. Respiratory: Negative for shortness of breath. Gastrointestinal: Negative for abdominal pain, vomiting  Genitourinary: Negative for urinary compaints Musculoskeletal: Negative for musculoskeletal complaints Skin: Negative for skin complaints  Neurological: Negative for headache All other ROS negative  ____________________________________________   PHYSICAL EXAM:  VITAL SIGNS: ED Triage Vitals  Enc Vitals Group     BP 06/15/18 1606 (!) 138/94     Pulse Rate 06/15/18 1606 95     Resp 06/15/18 1606 18     Temp --      Temp src --      SpO2 06/15/18 1606 92 %     Weight --      Height --      Head Circumference --      Peak Flow --      Pain Score 06/15/18 1648 0     Pain Loc --      Pain Edu? --      Excl. in GC? --     Constitutional: Patient was  initially agitated requiring IM sedation, during my repeat evaluation he is awake he is alert, much calmer.  Denies any drugs or alcohol today. Eyes: Normal exam ENT   Head: Normocephalic and atraumatic.   Mouth/Throat: Mucous membranes are moist. Cardiovascular: Normal rate, regular rhythm. No murmur Respiratory: Normal respiratory effort without tachypnea nor retractions. Breath sounds are clear Gastrointestinal: Soft and nontender. No distention.   Musculoskeletal: Nontender with normal range of motion in all extremities. Neurologic: Moves all extremities well, able to stand up and ambulate without issue.  Does have slightly slurred  speech however this is after IM sedation. Skin:  Skin is warm, dry and intact.  Psychiatric: Mood and affect are normal.   ____________________________________________    INITIAL IMPRESSION / ASSESSMENT AND PLAN / ED COURSE  Pertinent labs & imaging results that were available during my care of the patient were reviewed by me and considered in my medical decision making (see chart for details).  Patient presents to the emergency department under IVC.  Upon arrival patient is extremely agitated refusing blood draw refusing to dress out.  Decision was made by myself for IM sedation.  Approximately 30 minutes after IM sedation patient is much more calm, he is awake he is alert, allowed examination at that time is calm and pleasant.  Has slightly slurred speech however this is after sedation denies alcohol or substance use.  Has a history of substance use in the past.  We will maintain the patient's IVC have psychiatry and TTS see the patient, we will check labs and continue to closely monitor.  Labs are largely within normal limits, besides an alcohol level of 221.  Cocaine positive urine drug screen.  Psychiatric evaluation pending.  ____________________________________________   FINAL CLINICAL IMPRESSION(S) / ED DIAGNOSES  Involuntary commitment Agitation    Minna Antis, MD 06/15/18 2316

## 2018-06-15 NOTE — ED Notes (Signed)
Changed pt into wine colored scrubs.  Pt had a gray t-shirt, jeans, belt, and gray sneakers.  Pt also had a blue lighter.

## 2018-06-16 DIAGNOSIS — F1994 Other psychoactive substance use, unspecified with psychoactive substance-induced mood disorder: Secondary | ICD-10-CM

## 2018-06-16 MED ORDER — TRAZODONE HCL 100 MG PO TABS: 100.0000 mg | ORAL_TABLET | Freq: Every evening | ORAL | 0 refills | Status: DC | PRN

## 2018-06-16 MED ORDER — CITALOPRAM HYDROBROMIDE 20 MG PO TABS: 20.0000 mg | ORAL_TABLET | Freq: Every day | ORAL | 0 refills | Status: DC

## 2018-06-16 NOTE — ED Notes (Signed)
BEHAVIORAL HEALTH ROUNDING Patient sleeping: No. Patient alert and oriented: yes Behavior appropriate: Yes.  ; If no, describe:  Nutrition and fluids offered: yes Toileting and hygiene offered: Yes  Sitter present: q15 minute observations and security  monitoring Law enforcement present: Yes  ODS  
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
## 2018-06-16 NOTE — ED Notes (Signed)

## 2018-06-16 NOTE — ED Notes (Signed)
Patient observed lying in bed with eyes closed  Even, unlabored respirations observed   NAD pt appears to be sleeping  I will continue to monitor along with every 15 minute visual observations and ongoing security monitoring    

## 2018-06-16 NOTE — ED Notes (Signed)
ED  Is the patient under IVC or is there intent for IVC: Yes.   Is the patient medically cleared: Yes.   Is there vacancy in the ED BHU: Yes.   Is the population mix appropriate for patient: Yes.   Is the patient awaiting placement in inpatient or outpatient setting: Yes.   Has the patient had a psychiatric consult:   Consult pending   Survey of unit performed for contraband, proper placement and condition of furniture, tampering with fixtures in bathroom, shower, and each patient room: Yes.  ; Findings:  APPEARANCE/BEHAVIOR Calm and cooperative NEURO ASSESSMENT Orientation: oriented x3  Denies pain Hallucinations: No.None noted (Hallucinations) denies  Speech: Normal Gait: normal RESPIRATORY ASSESSMENT Even  Unlabored respirations  CARDIOVASCULAR ASSESSMENT Pulses equal   regular rate  Skin warm and dry   GASTROINTESTINAL ASSESSMENT no GI complaint EXTREMITIES Full ROM  PLAN OF CARE Provide calm/safe environment. Vital signs assessed twice daily. ED BHU Assessment once each 12-hour shift. Collaborate with TTS when available or as condition indicates. Assure the ED provider has rounded once each shift. Provide and encourage hygiene. Provide redirection as needed. Assess for escalating behavior; address immediately and inform ED provider.  Assess family dynamic and appropriateness for visitation as needed: Yes.  ; If necessary, describe findings:  Educate the patient/family about BHU procedures/visitation: Yes.  ; If necessary, describe findings:

## 2018-06-16 NOTE — ED Provider Notes (Signed)
Patient has been cleared by psychiatry for discharge   Emily Filbert, MD 06/16/18 1500

## 2018-12-25 ENCOUNTER — Other Ambulatory Visit: Payer: Self-pay

## 2018-12-25 ENCOUNTER — Emergency Department: Payer: Medicare Other

## 2018-12-25 ENCOUNTER — Inpatient Hospital Stay
Admission: EM | Admit: 2018-12-25 | Discharge: 2018-12-26 | DRG: 536 | Disposition: A | Discharge status: Other Acute Inpatient Hospital | Payer: Medicare Other | Attending: Internal Medicine | Admitting: Internal Medicine

## 2018-12-25 DIAGNOSIS — F209 Schizophrenia, unspecified: Secondary | ICD-10-CM | POA: Diagnosis present

## 2018-12-25 DIAGNOSIS — Z716 Tobacco abuse counseling: Secondary | ICD-10-CM

## 2018-12-25 DIAGNOSIS — T148XXA Other injury of unspecified body region, initial encounter: Secondary | ICD-10-CM

## 2018-12-25 DIAGNOSIS — F1020 Alcohol dependence, uncomplicated: Secondary | ICD-10-CM | POA: Diagnosis present

## 2018-12-25 DIAGNOSIS — I10 Essential (primary) hypertension: Secondary | ICD-10-CM | POA: Diagnosis present

## 2018-12-25 DIAGNOSIS — Z89611 Acquired absence of right leg above knee: Secondary | ICD-10-CM

## 2018-12-25 DIAGNOSIS — F1420 Cocaine dependence, uncomplicated: Secondary | ICD-10-CM | POA: Diagnosis present

## 2018-12-25 DIAGNOSIS — S72141A Displaced intertrochanteric fracture of right femur, initial encounter for closed fracture: Principal | ICD-10-CM | POA: Diagnosis present

## 2018-12-25 DIAGNOSIS — Z79899 Other long term (current) drug therapy: Secondary | ICD-10-CM

## 2018-12-25 DIAGNOSIS — F319 Bipolar disorder, unspecified: Secondary | ICD-10-CM | POA: Diagnosis present

## 2018-12-25 DIAGNOSIS — M25551 Pain in right hip: Secondary | ICD-10-CM | POA: Diagnosis not present

## 2018-12-25 DIAGNOSIS — S72001A Fracture of unspecified part of neck of right femur, initial encounter for closed fracture: Secondary | ICD-10-CM | POA: Diagnosis present

## 2018-12-25 DIAGNOSIS — F17210 Nicotine dependence, cigarettes, uncomplicated: Secondary | ICD-10-CM | POA: Diagnosis present

## 2018-12-25 LAB — CBC
HCT: 44.1 % (ref 39.0–52.0)
Hemoglobin: 14.3 g/dL (ref 13.0–17.0)
MCH: 27.8 pg (ref 26.0–34.0)
MCHC: 32.4 g/dL (ref 30.0–36.0)
MCV: 85.8 fL (ref 80.0–100.0)
Platelets: 270 10*3/uL (ref 150–400)
RBC: 5.14 MIL/uL (ref 4.22–5.81)
RDW: 14.2 % (ref 11.5–15.5)
WBC: 11.6 10*3/uL — ABNORMAL HIGH (ref 4.0–10.5)
nRBC: 0 % (ref 0.0–0.2)

## 2018-12-25 MED ORDER — HYDROMORPHONE HCL 1 MG/ML IJ SOLN
1.0000 mg | Freq: Once | INTRAMUSCULAR | Status: AC
  Administered 2018-12-25: 1 mg via INTRAVENOUS
  Filled 2018-12-25: qty 1
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
## 2018-12-25 NOTE — ED Notes (Signed)
Patient transported to X-ray 

## 2018-12-25 NOTE — ED Notes (Signed)
Dr. Brown at bedside

## 2018-12-26 ENCOUNTER — Encounter (HOSPITAL_COMMUNITY)
Admission: RE | Disposition: A | Discharge status: Home Health | Payer: Self-pay | Source: Other Acute Inpatient Hospital | Attending: Orthopedic Surgery

## 2018-12-26 ENCOUNTER — Inpatient Hospital Stay (HOSPITAL_COMMUNITY)
Admission: RE | Admit: 2018-12-26 | Discharge: 2018-12-28 | DRG: 481 | Disposition: A | Discharge status: Home Health | Payer: Medicare Other | Source: Other Acute Inpatient Hospital | Attending: Orthopedic Surgery | Admitting: Orthopedic Surgery

## 2018-12-26 ENCOUNTER — Ambulatory Visit (HOSPITAL_COMMUNITY): Payer: Medicare Other | Admitting: Anesthesiology

## 2018-12-26 ENCOUNTER — Other Ambulatory Visit: Payer: Self-pay

## 2018-12-26 ENCOUNTER — Inpatient Hospital Stay (HOSPITAL_COMMUNITY): Payer: Medicare Other

## 2018-12-26 ENCOUNTER — Inpatient Hospital Stay: Payer: Medicare Other

## 2018-12-26 ENCOUNTER — Ambulatory Visit (HOSPITAL_COMMUNITY): Payer: Medicare Other

## 2018-12-26 DIAGNOSIS — B1920 Unspecified viral hepatitis C without hepatic coma: Secondary | ICD-10-CM | POA: Diagnosis present

## 2018-12-26 DIAGNOSIS — Z79899 Other long term (current) drug therapy: Secondary | ICD-10-CM | POA: Diagnosis not present

## 2018-12-26 DIAGNOSIS — M25551 Pain in right hip: Secondary | ICD-10-CM | POA: Diagnosis present

## 2018-12-26 DIAGNOSIS — S72001A Fracture of unspecified part of neck of right femur, initial encounter for closed fracture: Secondary | ICD-10-CM

## 2018-12-26 DIAGNOSIS — X501XXA Overexertion from prolonged static or awkward postures, initial encounter: Secondary | ICD-10-CM

## 2018-12-26 DIAGNOSIS — S72041A Displaced fracture of base of neck of right femur, initial encounter for closed fracture: Principal | ICD-10-CM | POA: Diagnosis present

## 2018-12-26 DIAGNOSIS — Z9114 Patient's other noncompliance with medication regimen: Secondary | ICD-10-CM | POA: Diagnosis not present

## 2018-12-26 DIAGNOSIS — Z89511 Acquired absence of right leg below knee: Secondary | ICD-10-CM | POA: Diagnosis not present

## 2018-12-26 DIAGNOSIS — E8889 Other specified metabolic disorders: Secondary | ICD-10-CM | POA: Diagnosis present

## 2018-12-26 DIAGNOSIS — F17210 Nicotine dependence, cigarettes, uncomplicated: Secondary | ICD-10-CM | POA: Diagnosis present

## 2018-12-26 DIAGNOSIS — F1424 Cocaine dependence with cocaine-induced mood disorder: Secondary | ICD-10-CM | POA: Diagnosis present

## 2018-12-26 DIAGNOSIS — D62 Acute posthemorrhagic anemia: Secondary | ICD-10-CM | POA: Diagnosis present

## 2018-12-26 DIAGNOSIS — F10229 Alcohol dependence with intoxication, unspecified: Secondary | ICD-10-CM | POA: Diagnosis present

## 2018-12-26 DIAGNOSIS — F209 Schizophrenia, unspecified: Secondary | ICD-10-CM | POA: Diagnosis present

## 2018-12-26 DIAGNOSIS — Z716 Tobacco abuse counseling: Secondary | ICD-10-CM | POA: Diagnosis not present

## 2018-12-26 DIAGNOSIS — E559 Vitamin D deficiency, unspecified: Secondary | ICD-10-CM | POA: Diagnosis present

## 2018-12-26 DIAGNOSIS — F1020 Alcohol dependence, uncomplicated: Secondary | ICD-10-CM | POA: Diagnosis present

## 2018-12-26 DIAGNOSIS — S72141A Displaced intertrochanteric fracture of right femur, initial encounter for closed fracture: Secondary | ICD-10-CM | POA: Diagnosis present

## 2018-12-26 DIAGNOSIS — I10 Essential (primary) hypertension: Secondary | ICD-10-CM | POA: Diagnosis present

## 2018-12-26 DIAGNOSIS — G8929 Other chronic pain: Secondary | ICD-10-CM | POA: Diagnosis present

## 2018-12-26 DIAGNOSIS — F1420 Cocaine dependence, uncomplicated: Secondary | ICD-10-CM | POA: Diagnosis present

## 2018-12-26 DIAGNOSIS — Z89611 Acquired absence of right leg above knee: Secondary | ICD-10-CM | POA: Diagnosis not present

## 2018-12-26 DIAGNOSIS — R768 Other specified abnormal immunological findings in serum: Secondary | ICD-10-CM | POA: Diagnosis present

## 2018-12-26 DIAGNOSIS — F319 Bipolar disorder, unspecified: Secondary | ICD-10-CM | POA: Diagnosis present

## 2018-12-26 DIAGNOSIS — F1994 Other psychoactive substance use, unspecified with psychoactive substance-induced mood disorder: Secondary | ICD-10-CM | POA: Diagnosis present

## 2018-12-26 DIAGNOSIS — F17200 Nicotine dependence, unspecified, uncomplicated: Secondary | ICD-10-CM | POA: Diagnosis present

## 2018-12-26 DIAGNOSIS — Z419 Encounter for procedure for purposes other than remedying health state, unspecified: Secondary | ICD-10-CM

## 2018-12-26 HISTORY — PX: INTRAMEDULLARY (IM) NAIL INTERTROCHANTERIC: SHX5875

## 2018-12-26 HISTORY — DX: Vitamin D deficiency, unspecified: E55.9

## 2018-12-26 HISTORY — DX: Patient's other noncompliance with medication regimen: Z91.14

## 2018-12-26 HISTORY — DX: Personal history of other infectious and parasitic diseases: Z86.19

## 2018-12-26 LAB — COMPREHENSIVE METABOLIC PANEL
ALT: 17 U/L (ref 0–44)
AST: 23 U/L (ref 15–41)
Albumin: 3.7 g/dL (ref 3.5–5.0)
Alkaline Phosphatase: 75 U/L (ref 38–126)
Anion gap: 11 (ref 5–15)
BUN: 8 mg/dL (ref 6–20)
CO2: 23 mmol/L (ref 22–32)
Calcium: 9 mg/dL (ref 8.9–10.3)
Chloride: 105 mmol/L (ref 98–111)
Creatinine, Ser: 0.92 mg/dL (ref 0.61–1.24)
GFR calc Af Amer: >60 mL/min (ref >60)
GFR calc non Af Amer: >60 mL/min (ref >60)
Glucose, Bld: 130 mg/dL — ABNORMAL HIGH (ref 70–99)
Potassium: 3.8 mmol/L (ref 3.5–5.1)
Sodium: 139 mmol/L (ref 135–145)
Total Bilirubin: 0.4 mg/dL (ref 0.3–1.2)
Total Protein: 7.6 g/dL (ref 6.5–8.1)

## 2018-12-26 LAB — APTT: aPTT: 27 seconds (ref 24–36)

## 2018-12-26 LAB — CBC
HCT: 43.9 % (ref 39.0–52.0)
HCT: 41.9 % (ref 39.0–52.0)
Hemoglobin: 14.2 g/dL (ref 13.0–17.0)
Hemoglobin: 13.7 g/dL (ref 13.0–17.0)
MCH: 27.8 pg (ref 26.0–34.0)
MCH: 27.9 pg (ref 26.0–34.0)
MCHC: 32.3 g/dL (ref 30.0–36.0)
MCHC: 32.7 g/dL (ref 30.0–36.0)
MCV: 85.9 fL (ref 80.0–100.0)
MCV: 85.3 fL (ref 80.0–100.0)
Platelets: 270 10*3/uL (ref 150–400)
Platelets: 264 10*3/uL (ref 150–400)
RBC: 5.11 MIL/uL (ref 4.22–5.81)
RBC: 4.91 MIL/uL (ref 4.22–5.81)
RDW: 14.3 % (ref 11.5–15.5)
RDW: 14.3 % (ref 11.5–15.5)
WBC: 15.3 10*3/uL — ABNORMAL HIGH (ref 4.0–10.5)
WBC: 15.1 10*3/uL — ABNORMAL HIGH (ref 4.0–10.5)
nRBC: 0 % (ref 0.0–0.2)
nRBC: 0 % (ref 0.0–0.2)

## 2018-12-26 LAB — BASIC METABOLIC PANEL
Anion gap: 11 (ref 5–15)
BUN: 8 mg/dL (ref 6–20)
CO2: 24 mmol/L (ref 22–32)
Calcium: 8.9 mg/dL (ref 8.9–10.3)
Chloride: 105 mmol/L (ref 98–111)
Creatinine, Ser: 0.8 mg/dL (ref 0.61–1.24)
GFR calc Af Amer: >60 mL/min (ref >60)
GFR calc non Af Amer: >60 mL/min (ref >60)
Glucose, Bld: 101 mg/dL — ABNORMAL HIGH (ref 70–99)
Potassium: 4.2 mmol/L (ref 3.5–5.1)
Sodium: 140 mmol/L (ref 135–145)

## 2018-12-26 LAB — PROTIME-INR
INR: 1 (ref 0.8–1.2)
Prothrombin Time: 12.8 seconds (ref 11.4–15.2)

## 2018-12-26 LAB — URINE DRUG SCREEN, QUALITATIVE (ARMC ONLY)
Amphetamines, Ur Screen: NOT DETECTED
Barbiturates, Ur Screen: NOT DETECTED
Benzodiazepine, Ur Scrn: NOT DETECTED
Cannabinoid 50 Ng, Ur ~~LOC~~: NOT DETECTED
Cocaine Metabolite,Ur ~~LOC~~: POSITIVE — AB
MDMA (Ecstasy)Ur Screen: NOT DETECTED
Methadone Scn, Ur: NOT DETECTED
Opiate, Ur Screen: NOT DETECTED
Phencyclidine (PCP) Ur S: NOT DETECTED
Tricyclic, Ur Screen: NOT DETECTED

## 2018-12-26 LAB — CREATININE, SERUM
Creatinine, Ser: 1.01 mg/dL (ref 0.61–1.24)
GFR calc Af Amer: >60 mL/min (ref >60)
GFR calc non Af Amer: >60 mL/min (ref >60)

## 2018-12-26 LAB — ETHANOL: Alcohol, Ethyl (B): 153 mg/dL — ABNORMAL HIGH (ref <10)

## 2018-12-26 LAB — SURGICAL PCR SCREEN
MRSA, PCR: NEGATIVE
Staphylococcus aureus: NEGATIVE

## 2018-12-26 SURGERY — FIXATION, FRACTURE, INTERTROCHANTERIC, WITH INTRAMEDULLARY ROD
Anesthesia: General | Laterality: Right

## 2018-12-26 MED ORDER — THIAMINE HCL 100 MG/ML IJ SOLN: 100.0000 mg | Freq: Every day | INTRAMUSCULAR | Status: DC

## 2018-12-26 MED ORDER — ACETAMINOPHEN 325 MG PO TABS: 325.0000 mg | ORAL_TABLET | Freq: Four times a day (QID) | ORAL | Status: DC | PRN

## 2018-12-26 MED ORDER — HYDROCODONE-ACETAMINOPHEN 5-325 MG PO TABS
1.0000 | ORAL_TABLET | ORAL | Status: DC | PRN
  Administered 2018-12-26 – 2018-12-27 (×2): 2 via ORAL
  Filled 2018-12-26 (×3): qty 2

## 2018-12-26 MED ORDER — HYDROMORPHONE HCL 1 MG/ML IJ SOLN
INTRAMUSCULAR | Status: AC
  Filled 2018-12-26: qty 1

## 2018-12-26 MED ORDER — VITAMIN B-1 100 MG PO TABS
100.0000 mg | ORAL_TABLET | Freq: Every day | ORAL | Status: DC
  Administered 2018-12-27 – 2018-12-28 (×2): 100 mg via ORAL
  Filled 2018-12-26 (×2): qty 1

## 2018-12-26 MED ORDER — TRAZODONE HCL 50 MG PO TABS: 100.0000 mg | ORAL_TABLET | Freq: Every evening | ORAL | Status: DC | PRN

## 2018-12-26 MED ORDER — ROCURONIUM BROMIDE 10 MG/ML (PF) SYRINGE
PREFILLED_SYRINGE | INTRAVENOUS | Status: DC | PRN
  Administered 2018-12-26: 50 mg via INTRAVENOUS
  Administered 2018-12-26: 20 mg via INTRAVENOUS

## 2018-12-26 MED ORDER — HYDRALAZINE HCL 20 MG/ML IJ SOLN
10.0000 mg | Freq: Four times a day (QID) | INTRAMUSCULAR | Status: DC | PRN
  Administered 2018-12-26: 12:00:00 10 mg via INTRAVENOUS
  Filled 2018-12-26: qty 1

## 2018-12-26 MED ORDER — PHENYLEPHRINE 40 MCG/ML (10ML) SYRINGE FOR IV PUSH (FOR BLOOD PRESSURE SUPPORT)
PREFILLED_SYRINGE | INTRAVENOUS | Status: DC | PRN
  Administered 2018-12-26: 100 ug via INTRAVENOUS

## 2018-12-26 MED ORDER — LORAZEPAM 1 MG PO TABS
1.0000 mg | ORAL_TABLET | Freq: Four times a day (QID) | ORAL | Status: DC | PRN
  Administered 2018-12-26: 22:00:00 1 mg via ORAL

## 2018-12-26 MED ORDER — ONDANSETRON HCL 4 MG PO TABS
4.0000 mg | ORAL_TABLET | Freq: Four times a day (QID) | ORAL | Status: DC | PRN
  Filled 2018-12-26: qty 1

## 2018-12-26 MED ORDER — ACETAMINOPHEN 500 MG PO TABS
1000.0000 mg | ORAL_TABLET | Freq: Three times a day (TID) | ORAL | Status: DC
  Administered 2018-12-27 – 2018-12-28 (×3): 1000 mg via ORAL
  Filled 2018-12-26 (×2): qty 2

## 2018-12-26 MED ORDER — ACETAMINOPHEN 325 MG PO TABS: 650.0000 mg | ORAL_TABLET | Freq: Four times a day (QID) | ORAL | Status: DC | PRN

## 2018-12-26 MED ORDER — SUCCINYLCHOLINE CHLORIDE 20 MG/ML IJ SOLN
INTRAMUSCULAR | Status: DC | PRN
  Administered 2018-12-26: 100 mg via INTRAVENOUS

## 2018-12-26 MED ORDER — VITAMIN C 500 MG PO TABS
1000.0000 mg | ORAL_TABLET | Freq: Every day | ORAL | Status: DC
  Administered 2018-12-27 – 2018-12-28 (×2): 1000 mg via ORAL
  Filled 2018-12-26 (×2): qty 2

## 2018-12-26 MED ORDER — PROMETHAZINE HCL 25 MG/ML IJ SOLN: 6.2500 mg | INTRAMUSCULAR | Status: DC | PRN

## 2018-12-26 MED ORDER — METOCLOPRAMIDE HCL 5 MG PO TABS: 5.0000 mg | ORAL_TABLET | Freq: Three times a day (TID) | ORAL | Status: DC | PRN

## 2018-12-26 MED ORDER — HYDROCODONE-ACETAMINOPHEN 7.5-325 MG PO TABS
1.0000 | ORAL_TABLET | Freq: Once | ORAL | Status: AC | PRN
  Administered 2018-12-26: 20:00:00 1 via ORAL

## 2018-12-26 MED ORDER — POTASSIUM CHLORIDE IN NACL 20-0.9 MEQ/L-% IV SOLN
INTRAVENOUS | Status: DC
  Administered 2018-12-26 – 2018-12-27 (×2): via INTRAVENOUS
  Filled 2018-12-26 (×2): qty 1000

## 2018-12-26 MED ORDER — LABETALOL HCL 5 MG/ML IV SOLN
INTRAVENOUS | Status: AC
  Administered 2018-12-26: 20:00:00 5 mg via INTRAVENOUS
  Filled 2018-12-26: qty 4

## 2018-12-26 MED ORDER — SUGAMMADEX SODIUM 200 MG/2ML IV SOLN
INTRAVENOUS | Status: DC | PRN
  Administered 2018-12-26: 200 mg via INTRAVENOUS

## 2018-12-26 MED ORDER — LORAZEPAM 2 MG/ML IJ SOLN: 0.0000 mg | Freq: Four times a day (QID) | INTRAMUSCULAR | Status: DC

## 2018-12-26 MED ORDER — ACETAMINOPHEN 10 MG/ML IV SOLN
INTRAVENOUS | Status: AC
  Administered 2018-12-26: 1000 mg via INTRAVENOUS
  Filled 2018-12-26: qty 100

## 2018-12-26 MED ORDER — FENTANYL CITRATE (PF) 100 MCG/2ML IJ SOLN
INTRAMUSCULAR | Status: DC | PRN
  Administered 2018-12-26: 100 ug via INTRAVENOUS
  Administered 2018-12-26: 50 ug via INTRAVENOUS
  Administered 2018-12-26: 100 ug via INTRAVENOUS

## 2018-12-26 MED ORDER — MIDAZOLAM HCL 5 MG/5ML IJ SOLN
INTRAMUSCULAR | Status: DC | PRN
  Administered 2018-12-26: 2 mg via INTRAVENOUS

## 2018-12-26 MED ORDER — LORAZEPAM 1 MG PO TABS: 0.0000 mg | ORAL_TABLET | Freq: Two times a day (BID) | ORAL | Status: DC

## 2018-12-26 MED ORDER — DEXMEDETOMIDINE HCL IN NACL 200 MCG/50ML IV SOLN
INTRAVENOUS | Status: AC
  Filled 2018-12-26: qty 50

## 2018-12-26 MED ORDER — OXYCODONE HCL 5 MG PO TABS
5.0000 mg | ORAL_TABLET | ORAL | Status: DC | PRN
  Administered 2018-12-26: 08:00:00 5 mg via ORAL
  Filled 2018-12-26: qty 1

## 2018-12-26 MED ORDER — PROPOFOL 10 MG/ML IV BOLUS
INTRAVENOUS | Status: DC | PRN
  Administered 2018-12-26: 200 mg via INTRAVENOUS

## 2018-12-26 MED ORDER — HYDRALAZINE HCL 20 MG/ML IJ SOLN: 10.0000 mg | Freq: Three times a day (TID) | INTRAMUSCULAR | Status: DC | PRN

## 2018-12-26 MED ORDER — VITAMIN D 25 MCG (1000 UNIT) PO TABS
2000.0000 [IU] | ORAL_TABLET | Freq: Two times a day (BID) | ORAL | Status: DC
  Administered 2018-12-26 – 2018-12-28 (×4): 2000 [IU] via ORAL
  Filled 2018-12-26 (×4): qty 2

## 2018-12-26 MED ORDER — HYDROMORPHONE HCL 1 MG/ML IJ SOLN
0.5000 mg | INTRAMUSCULAR | Status: DC | PRN
  Administered 2018-12-26: 0.5 mg via INTRAVENOUS
  Filled 2018-12-26: qty 1

## 2018-12-26 MED ORDER — HYDROMORPHONE HCL 1 MG/ML IJ SOLN
INTRAMUSCULAR | Status: AC
  Administered 2018-12-26: 0.5 mg via INTRAVENOUS
  Filled 2018-12-26: qty 1

## 2018-12-26 MED ORDER — LIDOCAINE 2% (20 MG/ML) 5 ML SYRINGE
INTRAMUSCULAR | Status: AC
  Filled 2018-12-26: qty 5

## 2018-12-26 MED ORDER — ADULT MULTIVITAMIN W/MINERALS CH
1.0000 | ORAL_TABLET | Freq: Every day | ORAL | Status: DC
  Administered 2018-12-26 – 2018-12-28 (×3): 1 via ORAL
  Filled 2018-12-26 (×2): qty 1

## 2018-12-26 MED ORDER — ENOXAPARIN SODIUM 40 MG/0.4ML ~~LOC~~ SOLN
40.0000 mg | SUBCUTANEOUS | Status: DC
  Administered 2018-12-27 – 2018-12-28 (×2): 40 mg via SUBCUTANEOUS
  Filled 2018-12-26 (×2): qty 0.4

## 2018-12-26 MED ORDER — DEXAMETHASONE SODIUM PHOSPHATE 10 MG/ML IJ SOLN
INTRAMUSCULAR | Status: AC
  Filled 2018-12-26: qty 2

## 2018-12-26 MED ORDER — NICOTINE 14 MG/24HR TD PT24
14.0000 mg | MEDICATED_PATCH | Freq: Every day | TRANSDERMAL | Status: DC
  Administered 2018-12-26: 14 mg via TRANSDERMAL
  Filled 2018-12-26: qty 1

## 2018-12-26 MED ORDER — ADULT MULTIVITAMIN W/MINERALS CH
1.0000 | ORAL_TABLET | Freq: Every day | ORAL | Status: DC
  Administered 2018-12-26: 10:00:00 1 via ORAL
  Filled 2018-12-26: qty 1

## 2018-12-26 MED ORDER — 0.9 % SODIUM CHLORIDE (POUR BTL) OPTIME
TOPICAL | Status: DC | PRN
  Administered 2018-12-26: 1000 mL

## 2018-12-26 MED ORDER — AMLODIPINE BESYLATE 5 MG PO TABS
2.5000 mg | ORAL_TABLET | Freq: Every day | ORAL | Status: DC
  Administered 2018-12-26: 11:00:00 2.5 mg via ORAL
  Filled 2018-12-26: qty 1

## 2018-12-26 MED ORDER — LABETALOL HCL 5 MG/ML IV SOLN
5.0000 mg | INTRAVENOUS | Status: AC | PRN
  Administered 2018-12-26 (×4): 5 mg via INTRAVENOUS

## 2018-12-26 MED ORDER — ENOXAPARIN SODIUM 40 MG/0.4ML ~~LOC~~ SOLN: 40.0000 mg | SUBCUTANEOUS | Status: DC

## 2018-12-26 MED ORDER — LIDOCAINE 2% (20 MG/ML) 5 ML SYRINGE
INTRAMUSCULAR | Status: DC | PRN
  Administered 2018-12-26: 60 mg via INTRAVENOUS

## 2018-12-26 MED ORDER — ROCURONIUM BROMIDE 50 MG/5ML IV SOSY
PREFILLED_SYRINGE | INTRAVENOUS | Status: AC
  Filled 2018-12-26: qty 20

## 2018-12-26 MED ORDER — DEXMEDETOMIDINE HCL 200 MCG/2ML IV SOLN
INTRAVENOUS | Status: DC | PRN
  Administered 2018-12-26: 12 ug via INTRAVENOUS
  Administered 2018-12-26: 8 ug via INTRAVENOUS
  Administered 2018-12-26: 12 ug via INTRAVENOUS
  Administered 2018-12-26: 8 ug via INTRAVENOUS

## 2018-12-26 MED ORDER — ONDANSETRON HCL 4 MG/2ML IJ SOLN: 4.0000 mg | Freq: Four times a day (QID) | INTRAMUSCULAR | Status: DC | PRN

## 2018-12-26 MED ORDER — CITALOPRAM HYDROBROMIDE 20 MG PO TABS: 20.0000 mg | ORAL_TABLET | Freq: Every day | ORAL | Status: DC

## 2018-12-26 MED ORDER — SUCCINYLCHOLINE CHLORIDE 200 MG/10ML IV SOSY
PREFILLED_SYRINGE | INTRAVENOUS | Status: AC
  Filled 2018-12-26: qty 10

## 2018-12-26 MED ORDER — PHENYLEPHRINE 40 MCG/ML (10ML) SYRINGE FOR IV PUSH (FOR BLOOD PRESSURE SUPPORT)
PREFILLED_SYRINGE | INTRAVENOUS | Status: AC
  Filled 2018-12-26: qty 30

## 2018-12-26 MED ORDER — DOCUSATE SODIUM 100 MG PO CAPS
100.0000 mg | ORAL_CAPSULE | Freq: Two times a day (BID) | ORAL | Status: DC
  Administered 2018-12-26 – 2018-12-28 (×4): 100 mg via ORAL
  Filled 2018-12-26 (×4): qty 1

## 2018-12-26 MED ORDER — ONDANSETRON HCL 4 MG/2ML IJ SOLN
INTRAMUSCULAR | Status: DC | PRN
  Administered 2018-12-26: 4 mg via INTRAVENOUS

## 2018-12-26 MED ORDER — CEFAZOLIN SODIUM-DEXTROSE 1-4 GM/50ML-% IV SOLN
1.0000 g | Freq: Four times a day (QID) | INTRAVENOUS | Status: AC
  Administered 2018-12-26 – 2018-12-27 (×3): 1 g via INTRAVENOUS
  Filled 2018-12-26 (×4): qty 50

## 2018-12-26 MED ORDER — LACTATED RINGERS IV SOLN: INTRAVENOUS | Status: DC

## 2018-12-26 MED ORDER — GLYCOPYRROLATE 0.2 MG/ML IJ SOLN
INTRAMUSCULAR | Status: DC | PRN
  Administered 2018-12-26: .1 mg via INTRAVENOUS

## 2018-12-26 MED ORDER — PROPOFOL 10 MG/ML IV BOLUS
INTRAVENOUS | Status: AC
  Filled 2018-12-26: qty 20

## 2018-12-26 MED ORDER — METHOCARBAMOL 1000 MG/10ML IJ SOLN
500.0000 mg | Freq: Four times a day (QID) | INTRAVENOUS | Status: DC | PRN
  Filled 2018-12-26: qty 5

## 2018-12-26 MED ORDER — FENTANYL CITRATE (PF) 100 MCG/2ML IJ SOLN
INTRAMUSCULAR | Status: AC
  Administered 2018-12-26: 14:00:00 50 ug via INTRAVENOUS
  Filled 2018-12-26: qty 2

## 2018-12-26 MED ORDER — LORAZEPAM 2 MG/ML IJ SOLN: 0.0000 mg | Freq: Two times a day (BID) | INTRAMUSCULAR | Status: DC

## 2018-12-26 MED ORDER — HYDROCODONE-ACETAMINOPHEN 7.5-325 MG PO TABS
ORAL_TABLET | ORAL | Status: AC
  Filled 2018-12-26: qty 1

## 2018-12-26 MED ORDER — SODIUM CHLORIDE 0.9 % IV SOLN
INTRAVENOUS | Status: DC
  Administered 2018-12-26: 02:00:00 via INTRAVENOUS

## 2018-12-26 MED ORDER — DEXAMETHASONE SODIUM PHOSPHATE 10 MG/ML IJ SOLN
INTRAMUSCULAR | Status: DC | PRN
  Administered 2018-12-26: 10 mg via INTRAVENOUS

## 2018-12-26 MED ORDER — GLYCOPYRROLATE PF 0.2 MG/ML IJ SOSY
PREFILLED_SYRINGE | INTRAMUSCULAR | Status: AC
  Filled 2018-12-26: qty 1

## 2018-12-26 MED ORDER — FOLIC ACID 1 MG PO TABS
1.0000 mg | ORAL_TABLET | Freq: Every day | ORAL | Status: DC
  Administered 2018-12-26: 1 mg via ORAL
  Filled 2018-12-26: qty 1

## 2018-12-26 MED ORDER — CEFAZOLIN SODIUM-DEXTROSE 2-4 GM/100ML-% IV SOLN
2.0000 g | INTRAVENOUS | Status: AC
  Administered 2018-12-26: 2 g via INTRAVENOUS
  Filled 2018-12-26: qty 100

## 2018-12-26 MED ORDER — HYDROCODONE-ACETAMINOPHEN 7.5-325 MG PO TABS
1.0000 | ORAL_TABLET | ORAL | Status: DC | PRN
  Administered 2018-12-27: 04:00:00 2 via ORAL
  Filled 2018-12-26: qty 2

## 2018-12-26 MED ORDER — LORAZEPAM 2 MG/ML IJ SOLN: 1.0000 mg | Freq: Four times a day (QID) | INTRAMUSCULAR | Status: DC | PRN

## 2018-12-26 MED ORDER — FENTANYL CITRATE (PF) 250 MCG/5ML IJ SOLN
INTRAMUSCULAR | Status: AC
  Filled 2018-12-26: qty 5

## 2018-12-26 MED ORDER — VITAMIN B-1 100 MG PO TABS
100.0000 mg | ORAL_TABLET | Freq: Every day | ORAL | Status: DC
  Administered 2018-12-26: 100 mg via ORAL
  Filled 2018-12-26: qty 1

## 2018-12-26 MED ORDER — AMLODIPINE BESYLATE 2.5 MG PO TABS
2.5000 mg | ORAL_TABLET | Freq: Every day | ORAL | Status: DC
  Administered 2018-12-26 – 2018-12-27 (×2): 2.5 mg via ORAL
  Filled 2018-12-26: qty 1

## 2018-12-26 MED ORDER — ONDANSETRON HCL 4 MG/2ML IJ SOLN
INTRAMUSCULAR | Status: AC
  Filled 2018-12-26: qty 6

## 2018-12-26 MED ORDER — TRAZODONE HCL 100 MG PO TABS: 100.0000 mg | ORAL_TABLET | Freq: Every evening | ORAL | Status: DC | PRN

## 2018-12-26 MED ORDER — METOCLOPRAMIDE HCL 5 MG/ML IJ SOLN: 5.0000 mg | Freq: Three times a day (TID) | INTRAMUSCULAR | Status: DC | PRN

## 2018-12-26 MED ORDER — LORAZEPAM 1 MG PO TABS
0.0000 mg | ORAL_TABLET | Freq: Four times a day (QID) | ORAL | Status: DC
  Administered 2018-12-26: 22:00:00 1 mg via ORAL
  Filled 2018-12-26 (×2): qty 1

## 2018-12-26 MED ORDER — ACETAMINOPHEN 10 MG/ML IV SOLN
1000.0000 mg | Freq: Once | INTRAVENOUS | Status: DC | PRN
  Administered 2018-12-26: 20:00:00 1000 mg via INTRAVENOUS

## 2018-12-26 MED ORDER — HYDROMORPHONE HCL 1 MG/ML IJ SOLN
0.2500 mg | INTRAMUSCULAR | Status: DC | PRN
  Administered 2018-12-26 (×3): 0.5 mg via INTRAVENOUS

## 2018-12-26 MED ORDER — CITALOPRAM HYDROBROMIDE 20 MG PO TABS
20.0000 mg | ORAL_TABLET | Freq: Every day | ORAL | Status: DC
  Administered 2018-12-26: 11:00:00 20 mg via ORAL
  Filled 2018-12-26: qty 1

## 2018-12-26 MED ORDER — MORPHINE SULFATE (PF) 2 MG/ML IV SOLN
0.5000 mg | INTRAVENOUS | Status: DC | PRN
  Administered 2018-12-27 – 2018-12-28 (×4): 1 mg via INTRAVENOUS
  Filled 2018-12-26 (×5): qty 1

## 2018-12-26 MED ORDER — LORAZEPAM 1 MG PO TABS: 1.0000 mg | ORAL_TABLET | Freq: Four times a day (QID) | ORAL | Status: DC | PRN

## 2018-12-26 MED ORDER — ONDANSETRON HCL 4 MG PO TABS: 4.0000 mg | ORAL_TABLET | Freq: Four times a day (QID) | ORAL | Status: DC | PRN

## 2018-12-26 MED ORDER — LACTATED RINGERS IV SOLN
INTRAVENOUS | Status: DC | PRN
  Administered 2018-12-26: 17:00:00 via INTRAVENOUS

## 2018-12-26 MED ORDER — FENTANYL CITRATE (PF) 100 MCG/2ML IJ SOLN
50.0000 ug | Freq: Once | INTRAMUSCULAR | Status: AC
  Administered 2018-12-26: 14:00:00 50 ug via INTRAVENOUS

## 2018-12-26 MED ORDER — OXYCODONE HCL 5 MG PO TABS: 5.0000 mg | ORAL_TABLET | ORAL | Status: DC | PRN

## 2018-12-26 MED ORDER — ACETAMINOPHEN 650 MG RE SUPP: 650.0000 mg | Freq: Four times a day (QID) | RECTAL | Status: DC | PRN

## 2018-12-26 MED ORDER — FOLIC ACID 1 MG PO TABS
1.0000 mg | ORAL_TABLET | Freq: Every day | ORAL | Status: DC
  Administered 2018-12-26 – 2018-12-28 (×3): 1 mg via ORAL
  Filled 2018-12-26 (×2): qty 1

## 2018-12-26 MED ORDER — MIDAZOLAM HCL 2 MG/2ML IJ SOLN
INTRAMUSCULAR | Status: AC
  Filled 2018-12-26: qty 4

## 2018-12-26 SURGICAL SUPPLY — 54 items
BIT DRILL Q/COUPLING 1 (BIT) ×2 IMPLANT
BNDG COHESIVE 6X5 TAN STRL LF (GAUZE/BANDAGES/DRESSINGS) IMPLANT
BRUSH SCRUB SURG 4.25 DISP (MISCELLANEOUS) ×4 IMPLANT
COVER PERINEAL POST (MISCELLANEOUS) ×2 IMPLANT
COVER SURGICAL LIGHT HANDLE (MISCELLANEOUS) ×4 IMPLANT
COVER WAND RF STERILE (DRAPES) ×2 IMPLANT
DRAPE C-ARMOR (DRAPES) ×2 IMPLANT
DRAPE HALF SHEET 40X57 (DRAPES) IMPLANT
DRAPE ORTHO SPLIT 77X108 STRL (DRAPES)
DRAPE STERI IOBAN 125X83 (DRAPES) ×2 IMPLANT
DRAPE SURG ORHT 6 SPLT 77X108 (DRAPES) IMPLANT
DRAPE U-SHAPE 47X51 STRL (DRAPES) ×2 IMPLANT
DRSG EMULSION OIL 3X3 NADH (GAUZE/BANDAGES/DRESSINGS) ×2 IMPLANT
DRSG MEPILEX BORDER 4X4 (GAUZE/BANDAGES/DRESSINGS) ×2 IMPLANT
DRSG MEPILEX BORDER 4X8 (GAUZE/BANDAGES/DRESSINGS) ×2 IMPLANT
ELECT REM PT RETURN 9FT ADLT (ELECTROSURGICAL) ×2
ELECTRODE REM PT RTRN 9FT ADLT (ELECTROSURGICAL) ×1 IMPLANT
GLOVE BIO SURGEON STRL SZ7.5 (GLOVE) ×2 IMPLANT
GLOVE BIO SURGEON STRL SZ8 (GLOVE) ×2 IMPLANT
GLOVE BIOGEL PI IND STRL 7.5 (GLOVE) ×1 IMPLANT
GLOVE BIOGEL PI IND STRL 8 (GLOVE) ×1 IMPLANT
GLOVE BIOGEL PI INDICATOR 7.5 (GLOVE) ×1
GLOVE BIOGEL PI INDICATOR 8 (GLOVE) ×1
GOWN STRL REUS W/ TWL LRG LVL3 (GOWN DISPOSABLE) ×2 IMPLANT
GOWN STRL REUS W/ TWL XL LVL3 (GOWN DISPOSABLE) ×1 IMPLANT
GOWN STRL REUS W/TWL LRG LVL3 (GOWN DISPOSABLE) ×2
GOWN STRL REUS W/TWL XL LVL3 (GOWN DISPOSABLE) ×1
GUIDEPIN DHS/DCS (PIN) ×2 IMPLANT
KIT BASIN OR (CUSTOM PROCEDURE TRAY) ×2 IMPLANT
KIT TURNOVER KIT B (KITS) ×2 IMPLANT
LINER BOOT UNIVERSAL DISP (MISCELLANEOUS) ×2 IMPLANT
MANIFOLD NEPTUNE II (INSTRUMENTS) ×2 IMPLANT
NS IRRIG 1000ML POUR BTL (IV SOLUTION) ×2 IMPLANT
PACK GENERAL/GYN (CUSTOM PROCEDURE TRAY) ×2 IMPLANT
PAD ARMBOARD 7.5X6 YLW CONV (MISCELLANEOUS) ×4 IMPLANT
PLATE DHS 135 DEG/4HOLE (Plate) ×2 IMPLANT
SCREW COMPRESS 36MM DHS/DCS (Orthopedic Implant) ×2 IMPLANT
SCREW CORTEX ST 4.5X32 (Screw) ×2 IMPLANT
SCREW CORTEX ST 4.5X34 (Screw) ×4 IMPLANT
SCREW CORTEX ST 4.5X38 (Screw) ×2 IMPLANT
SCREW DHS LAG 85MM (Screw) ×2 IMPLANT
SCREW DHS LAG 90MM (Orthopedic Implant) ×2 IMPLANT
STAPLER VISISTAT 35W (STAPLE) ×2 IMPLANT
STOCKINETTE IMPERVIOUS LG (DRAPES) IMPLANT
SUT ETHILON 3 0 PS 1 (SUTURE) ×2 IMPLANT
SUT VIC AB 0 CT1 27 (SUTURE) ×1
SUT VIC AB 0 CT1 27XBRD ANBCTR (SUTURE) ×1 IMPLANT
SUT VIC AB 1 CT1 27 (SUTURE) ×1
SUT VIC AB 1 CT1 27XBRD ANBCTR (SUTURE) ×1 IMPLANT
SUT VIC AB 2-0 CT1 27 (SUTURE) ×1
SUT VIC AB 2-0 CT1 TAPERPNT 27 (SUTURE) ×1 IMPLANT
TOWEL OR 17X24 6PK STRL BLUE (TOWEL DISPOSABLE) ×2 IMPLANT
TOWEL OR 17X26 10 PK STRL BLUE (TOWEL DISPOSABLE) ×4 IMPLANT
WATER STERILE IRR 1000ML POUR (IV SOLUTION) ×2 IMPLANT
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
## 2018-12-26 NOTE — ED Notes (Signed)
EDT to CT to take pt to floor.

## 2018-12-26 NOTE — Anesthesia Procedure Notes (Signed)
Procedure Name: Intubation Date/Time: 12/26/2018 5:13 PM Performed by: Cleda Daub, CRNA Pre-anesthesia Checklist: Patient identified, Emergency Drugs available, Suction available and Patient being monitored Patient Re-evaluated:Patient Re-evaluated prior to induction Oxygen Delivery Method: Circle system utilized Preoxygenation: Pre-oxygenation with 100% oxygen Induction Type: IV induction, Inhalational induction with existing ETT and Rapid sequence Laryngoscope Size: Mac and 3 Grade View: Grade I Tube type: Oral Tube size: 7.5 mm Number of attempts: 1 Airway Equipment and Method: Stylet Placement Confirmation: ETT inserted through vocal cords under direct vision,  positive ETCO2 and breath sounds checked- equal and bilateral Secured at: 23 cm Tube secured with: Tape Dental Injury: Teeth and Oropharynx as per pre-operative assessment

## 2018-12-26 NOTE — Progress Notes (Signed)
10mg  hydralazine given prior to transport by [facility].
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
## 2018-12-26 NOTE — ED Notes (Signed)
Pt returned from xray
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
## 2018-12-26 NOTE — Anesthesia Postprocedure Evaluation (Signed)
Anesthesia Post Note  Patient: Sean Hoffman  Procedure(s) Performed: ORIF Right femoral neck fracture. (Right )     Patient location during evaluation: PACU Anesthesia Type: General Level of consciousness: sedated, patient cooperative and oriented Pain management: pain level controlled Vital Signs Assessment: post-procedure vital signs reviewed and stable Respiratory status: spontaneous breathing, nonlabored ventilation and respiratory function stable Cardiovascular status: blood pressure returned to baseline and stable Postop Assessment: no apparent nausea or vomiting Anesthetic complications: no    Last Vitals:  Vitals:   12/26/18 2058 12/26/18 2115  BP: (!) 145/107 (!) 177/102  Pulse: 66 66  Resp: 18 16  Temp:  36.9 C  SpO2: 94% 100%    Last Pain:  Vitals:   12/26/18 2115  TempSrc: Oral  PainSc:                  [name],E. Marcia Hartwell

## 2018-12-26 NOTE — Transfer of Care (Signed)
Immediate Anesthesia Transfer of Care Note  Patient: Sean Hoffman  Procedure(s) Performed: ORIF Right femoral neck fracture. (Right )  Patient Location: PACU  Anesthesia Type:General  Level of Consciousness: awake, drowsy and patient cooperative  Airway & Oxygen Therapy: Patient Spontanous Breathing and Patient connected to face mask oxygen  Post-op Assessment: Report given to RN and Post -op Vital signs reviewed and stable  Post vital signs: Reviewed and stable  Last Vitals:  Vitals Value Taken Time  BP    Temp    Pulse 67 12/26/2018  7:33 PM  Resp 14 12/26/2018  7:33 PM  SpO2 100 % 12/26/2018  7:33 PM  Vitals shown include unvalidated device data.  Last Pain:  Vitals:   12/26/18 1429  PainSc: 3       Patients Stated Pain Goal: 3 (12/26/18 1311)  Complications: No apparent anesthesia complications
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
## 2018-12-26 NOTE — Progress Notes (Signed)
Dr. Richardson Landry notified of NPO status. Belongings will be taken to PACU.

## 2018-12-26 NOTE — ED Notes (Signed)
Pt to CT

## 2018-12-26 NOTE — Anesthesia Preprocedure Evaluation (Signed)
Anesthesia Evaluation  Patient identified by MRN, date of birth, ID band Patient awake  General Assessment Comment:NPO since 0900  Reviewed: Allergy & Precautions, NPO status , Patient's Chart, lab work & pertinent test results  Airway Mallampati: II  TM Distance: >3 FB Neck ROM: Full    Dental no notable dental hx. (+) Poor Dentition, Dental Advisory Given,    Pulmonary Current Smoker,    Pulmonary exam normal breath sounds clear to auscultation       Cardiovascular hypertension, Pt. on medications Normal cardiovascular exam Rhythm:Regular Rate:Normal     Neuro/Psych PSYCHIATRIC DISORDERS Schizophrenia    GI/Hepatic (+)     substance abuse  alcohol use and cocaine use,   Endo/Other    Renal/GU Cr 0.80 K+ 4.2      Musculoskeletal S/P R BKA   Abdominal   Peds  Hematology Hgb 14.2 Plt 270   Anesthesia Other Findings   Reproductive/Obstetrics                             Drugs of Abuse     Component Value Date/Time   LABOPIA NONE DETECTED 12/26/2018 0011   COCAINSCRNUR POSITIVE (A) 12/26/2018 0011   LABBENZ NONE DETECTED 12/26/2018 0011   AMPHETMU NONE DETECTED 12/26/2018 0011   THCU NONE DETECTED 12/26/2018 0011   LABBARB NONE DETECTED 12/26/2018 0011    Anesthesia Physical Anesthesia Plan  ASA: III  Anesthesia Plan: General   Post-op Pain Management:    Induction: Intravenous  PONV Risk Score and Plan: 2 and Treatment may vary due to age or medical condition, Ondansetron and Dexamethasone  Airway Management Planned: Oral ETT  Additional Equipment:   Intra-op Plan:   Post-operative Plan: Extubation in OR  Informed Consent: I have reviewed the patients History and Physical, chart, labs and discussed the procedure including the risks, benefits and alternatives for the proposed anesthesia with the patient or authorized representative who has indicated his/her  understanding and acceptance.     Dental advisory given  Plan Discussed with:   Anesthesia Plan Comments: (ORIF R femoral neck fx)        Anesthesia Quick Evaluation

## 2018-12-27 ENCOUNTER — Encounter (HOSPITAL_COMMUNITY): Payer: Self-pay | Admitting: Orthopedic Surgery

## 2018-12-27 DIAGNOSIS — R768 Other specified abnormal immunological findings in serum: Secondary | ICD-10-CM | POA: Diagnosis present

## 2018-12-27 DIAGNOSIS — R7689 Other specified abnormal immunological findings in serum: Secondary | ICD-10-CM | POA: Diagnosis present

## 2018-12-27 LAB — CBC
HCT: 39.6 % (ref 39.0–52.0)
Hemoglobin: 12.9 g/dL — ABNORMAL LOW (ref 13.0–17.0)
MCH: 28 pg (ref 26.0–34.0)
MCHC: 32.6 g/dL (ref 30.0–36.0)
MCV: 85.9 fL (ref 80.0–100.0)
Platelets: 244 10*3/uL (ref 150–400)
RBC: 4.61 MIL/uL (ref 4.22–5.81)
RDW: 14.2 % (ref 11.5–15.5)
WBC: 12.7 10*3/uL — ABNORMAL HIGH (ref 4.0–10.5)
nRBC: 0 % (ref 0.0–0.2)

## 2018-12-27 LAB — URINALYSIS, ROUTINE W REFLEX MICROSCOPIC
Bilirubin Urine: NEGATIVE
Glucose, UA: NEGATIVE mg/dL
Hgb urine dipstick: NEGATIVE
Ketones, ur: NEGATIVE mg/dL
Leukocytes,Ua: NEGATIVE
Nitrite: NEGATIVE
Protein, ur: NEGATIVE mg/dL
Specific Gravity, Urine: 1.013 (ref 1.005–1.030)
pH: 7 (ref 5.0–8.0)

## 2018-12-27 LAB — HEMOGLOBIN A1C
Hgb A1c MFr Bld: 5.9 % — ABNORMAL HIGH (ref 4.8–5.6)
Mean Plasma Glucose: 122.63 mg/dL

## 2018-12-27 LAB — COMPREHENSIVE METABOLIC PANEL
ALT: 17 U/L (ref 0–44)
AST: 28 U/L (ref 15–41)
Albumin: 3.1 g/dL — ABNORMAL LOW (ref 3.5–5.0)
Alkaline Phosphatase: 68 U/L (ref 38–126)
Anion gap: 10 (ref 5–15)
BUN: 8 mg/dL (ref 6–20)
CO2: 25 mmol/L (ref 22–32)
Calcium: 8.8 mg/dL — ABNORMAL LOW (ref 8.9–10.3)
Chloride: 100 mmol/L (ref 98–111)
Creatinine, Ser: 0.98 mg/dL (ref 0.61–1.24)
GFR calc Af Amer: >60 mL/min (ref >60)
GFR calc non Af Amer: >60 mL/min (ref >60)
Glucose, Bld: 190 mg/dL — ABNORMAL HIGH (ref 70–99)
Potassium: 4 mmol/L (ref 3.5–5.1)
Sodium: 135 mmol/L (ref 135–145)
Total Bilirubin: 0.5 mg/dL (ref 0.3–1.2)
Total Protein: 6.6 g/dL (ref 6.5–8.1)

## 2018-12-27 LAB — HEPATITIS PANEL, ACUTE
HCV Ab: 7.4 s/co ratio — ABNORMAL HIGH (ref 0.0–0.9)
Hep A IgM: NEGATIVE
Hep B C IgM: NEGATIVE
Hepatitis B Surface Ag: NEGATIVE

## 2018-12-27 LAB — GLUCOSE, CAPILLARY
Glucose-Capillary: 128 mg/dL — ABNORMAL HIGH (ref 70–99)
Glucose-Capillary: 111 mg/dL — ABNORMAL HIGH (ref 70–99)
Glucose-Capillary: 133 mg/dL — ABNORMAL HIGH (ref 70–99)

## 2018-12-27 LAB — HIV ANTIBODY (ROUTINE TESTING W REFLEX): HIV Screen 4th Generation wRfx: NONREACTIVE

## 2018-12-27 MED ORDER — HYDRALAZINE HCL 20 MG/ML IJ SOLN
10.0000 mg | Freq: Once | INTRAMUSCULAR | Status: AC
  Administered 2018-12-27: 10:00:00 10 mg via INTRAVENOUS
  Filled 2018-12-27: qty 1

## 2018-12-27 MED ORDER — PANTOPRAZOLE SODIUM 40 MG PO TBEC
40.0000 mg | DELAYED_RELEASE_TABLET | Freq: Every day | ORAL | Status: DC
  Administered 2018-12-27 – 2018-12-28 (×2): 40 mg via ORAL
  Filled 2018-12-27 (×2): qty 1

## 2018-12-27 MED ORDER — AMLODIPINE BESYLATE 5 MG PO TABS
5.0000 mg | ORAL_TABLET | Freq: Every day | ORAL | Status: DC
  Administered 2018-12-28: 5 mg via ORAL
  Filled 2018-12-27: qty 1

## 2018-12-27 MED ORDER — METHOCARBAMOL 500 MG PO TABS
500.0000 mg | ORAL_TABLET | Freq: Three times a day (TID) | ORAL | Status: DC | PRN
  Administered 2018-12-27: 14:00:00 500 mg via ORAL
  Filled 2018-12-27: qty 1

## 2018-12-27 NOTE — Evaluation (Signed)
Physical Therapy Evaluation Patient Details Name: Sean Hoffman MRN: [id number] DOB: [birth year] Today's Date: 12/27/2018   History of Present Illness  Pt is 53 yo male with R BKA since 53 yo who was intoxicated and with cocaine in his system when he fell while wearing prosthesis and sustained Comminuted R basicervical femoral neck fracture with intertrochanteric extension. Other PMH: HTN, bipolar, schizophrenia  Clinical Impression  Pt admitted with above diagnosis. Pt currently with functional limitations due to the deficits listed below (see PT Problem List). Pt mobilizes well with RW, hopped 25' with min-guard A. Pt reports he has been falling but also reports he has been hopping around without his prosthesis and no AD. Discussed safety going forward. Will need RW at d/c.  Pt will benefit from skilled PT to increase their independence and safety with mobility to allow discharge to the venue listed below.       Follow Up Recommendations No PT follow up    Equipment Recommendations  Rolling walker with 5" wheels    Recommendations for Other Services       Precautions / Restrictions Precautions Precautions: Fall Precaution Comments: pt reports that he has fallen several times in the past 6 mos, also reports that he hops around the house without his prosthesis without an AD Restrictions Weight Bearing Restrictions: Yes RLE Weight Bearing: Touchdown weight bearing Other Position/Activity Restrictions: he can't don his prosthesis with TDWB status, educated on this      Mobility  Bed Mobility Overal bed mobility: Needs Assistance Bed Mobility: Supine to Sit     Supine to sit: Min assist     General bed mobility comments: min A to RLE scooting to EOB  Transfers Overall transfer level: Needs assistance Equipment used: Rolling walker (2 wheeled) Transfers: Sit to/from Stand Sit to Stand: Min assist         General transfer comment: vc's for hand placement, min A to  steady  Ambulation/Gait Ambulation/Gait assistance: Min guard Gait Distance (Feet): 25 Feet Assistive device: Rolling walker (2 wheeled) Gait Pattern/deviations: Step-to pattern Gait velocity: decreased Gait velocity interpretation: <1.8 ft/sec, indicate of risk for recurrent falls General Gait Details: pt able to hop safely on LLE with RW, instructed to not be up without AD at hospital or home  Stairs            Wheelchair Mobility    Modified Rankin (Stroke Patients Only)       Balance Overall balance assessment: Mild deficits observed, not formally tested                                           Pertinent Vitals/Pain Pain Assessment: 0-10 Pain Score: 3  Pain Location: RLE and back Pain Descriptors / Indicators: Aching;Sore Pain Intervention(s): Limited activity within patient's tolerance;Monitored during session    Home Living Family/patient expects to be discharged to:: Private residence Living Arrangements: Spouse/significant other Available Help at Discharge: Family;Available 24 hours/day Type of Home: House Home Access: Stairs to enter Entrance Stairs-Rails: None Entrance Stairs-Number of Steps: 1 Home Layout: One level Home Equipment: None      Prior Function Level of Independence: Independent               Hand Dominance        Extremity/Trunk Assessment   Upper Extremity Assessment Upper Extremity Assessment: Overall WFL for tasks assessed    Lower  Extremity Assessment Lower Extremity Assessment: RLE deficits/detail RLE Deficits / Details: R BKA, full knee ROM, hip NT due to pain RLE: Unable to fully assess due to pain RLE Sensation: WNL RLE Coordination: WNL    Cervical / Trunk Assessment Cervical / Trunk Assessment: Normal  Communication   Communication: No difficulties  Cognition Arousal/Alertness: Awake/alert Behavior During Therapy: WFL for tasks assessed/performed Overall Cognitive Status: Within  Functional Limits for tasks assessed                                        General Comments      Exercises Amputee Exercises Quad Sets: AROM;Right;10 reps;Seated Knee Flexion: AROM;Right;5 reps;Seated Knee Extension: AROM;Right;10 reps;Seated   Assessment/Plan    PT Assessment Patient needs continued PT services  PT Problem List Decreased activity tolerance;Decreased balance;Decreased mobility;Decreased knowledge of use of DME;Decreased safety awareness;Decreased knowledge of precautions;Pain       PT Treatment Interventions DME instruction;Gait training;Stair training;Functional mobility training;Therapeutic activities;Therapeutic exercise;Balance training;Neuromuscular re-education;Patient/family education    PT Goals (Current goals can be found in the Care Plan section)  Acute Rehab PT Goals Patient Stated Goal: return home PT Goal Formulation: With patient Time For Goal Achievement: 01/03/19 Potential to Achieve Goals: Good    Frequency Min 5X/week   Barriers to discharge        Co-evaluation               AM-PAC PT "6 Clicks" Mobility  Outcome Measure Help needed turning from your back to your side while in a flat bed without using bedrails?: A Little Help needed moving from lying on your back to sitting on the side of a flat bed without using bedrails?: A Little Help needed moving to and from a bed to a chair (including a wheelchair)?: A Little Help needed standing up from a chair using your arms (e.g., wheelchair or bedside chair)?: A Little Help needed to walk in hospital room?: A Little Help needed climbing 3-5 steps with a railing? : A Lot 6 Click Score: 17    End of Session Equipment Utilized During Treatment: Gait belt Activity Tolerance: Patient tolerated treatment well Patient left: in chair;with call bell/phone within reach Nurse Communication: Mobility status PT Visit Diagnosis: Unsteadiness on feet (R26.81);Pain;History of  falling (Z91.81) Pain - Right/Left: Right Pain - part of body: Leg    Time: [id number] PT Time Calculation (min) (ACUTE ONLY): 23 min   Charges:   PT Evaluation $PT Eval Low Complexity: 1 Low PT Treatments $Gait Training: 8-22 mins        Sean Hoffman, PT  Acute Rehab Services  Pager [phone] Office [phone]   Sean Hoffman 12/27/2018, 11:07 AM

## 2018-12-27 NOTE — Social Work (Signed)
CSW acknowledging consult for SNF placement. Will follow for therapy recommendations.   Vernie Vinciguerra, MSW, LCSWA [facility] [phone]   

## 2018-12-27 NOTE — Op Note (Signed)
NAMArdine Eng: Pianka, Chasen MEDICAL RECORD [id number] ACCOUNT [id number] DATE OF BIRTH:[birth year] FACILITY: [facility] LOCATION: [facility] PHYSICIAN:Imran Nuon H. Gabriele Loveland, MD  OPERATIVE REPORT  DATE OF PROCEDURE:  12/26/2018  PREOPERATIVE DIAGNOSES:   1.  Right basicervical femoral neck fracture. 2.  Remote right below knee amputation.  POSTOPERATIVE DIAGNOSES: 1.  Right basicervical femoral neck fracture. 2.  Remote right below knee amputation.  PROCEDURES: 1.  Open reduction internal fixation of right hip using a Dynamic hip screw from [employer]. 2.  Insertion and removal of right femoral traction pin.  SURGEON:  Myrene GalasMichael Cathlene Gardella, MD  ASSISTANT:  Montez MoritaKeith Paul, PA-C.  ANESTHESIA:  General.  COMPLICATIONS:  None.  ESTIMATED BLOOD LOSS:  Less than 100 mL.  COMPLICATIONS:  None.  DRAINS:  None.  SPECIMENS:  None.  PATIENT DISPOSITION:  To PACU.  CONDITION:  Stable.  INDICATIONS FOR PROCEDURE:  The patient is a 53 year old male who had a right below knee amputation when he was 16.  The patient has a history of polysubstance abuse including cocaine and alcohol.  He sustained a ground level fall resulting in a  comminuted displaced basicervical femoral neck fracture.  I discussed this case with [facility], Dr. Murlean CallerKevin Krasinksi, who felt that the potential for complications was high given the patient's personal medical history as well as the character  of the fracture, which was comminuted.  I offered to assist with further management, which he felt would be helpful given a fellowship trained orthopedic traumatology and practice devoted to that.  The patient was consequently transferred to our  institution.  I discussed with him the risks and benefits of surgery including the need to place a skeletal traction pin in his distal femur through which we could pull traction and established reduction of the fracture and then the dynamic hip screw,  though seldom used, currently offered him the best  chance of reduction of his fracture as an intramedullary nail may have a potential to displace the neck segment on the most proximal or cephalad side of the fracture.  Risks discussed included avascular  necrosis, symptomatic hardware, malunion, nonunion, infection, need for further surgery, and others.  We also discussed the importance of avoiding substance abuse during the healing process.  The patient acknowledged these risks and provided consent to  proceed.  SUMMARY OF PROCEDURE:  The patient was taken to the operating room where general anesthesia was induced.  We performed a prep and drape of the distal femur and then inserted a 3.55 mm traction pin given the type of bow that was available to pull  distraction through.  This was then hooked to the bow on the traction table.  The well leg was placed in lithotomy position.  We performed reduction and dialed this in on both AP and lateral views though there remained some distraction.  We then  performed a standard prep and drape of the hip area.  C-arm was brought in to identify the correct starting point and trajectory on both AP and lateral views.  A 7 cm incision was made.  Dissection carried down to the tensor, which was split in line with  the skin.  The vastus was then split along the fascia and the muscle belly swept proximally from distal to proximal to preserve the integrity of the muscle as we got close to the bone.  A ____ were placed over the anterior cortex.  The perforating  branches identified and divided after electrocautery.  There were really only 2 branching  vessels in this area.  The guide for the guide pin was then placed along the lateral femur and the guide pin directed across the neck and into the head into the  center-center position on both AP and lateral views.  This was followed by measuring it to a 95.  It was drilled to a 90.  A 90 screw was placed and then the side plate.  I used the compression device after application of  screws into the side plate to  compress the head segment, but it bottomed out and did not give [place] sufficient compression.  Consequently, I removed the side plate and changed the lag screw from 90-85 mm inserting it into the appropriate depth, replacing the side plate and then placing  the standard screws in the side plate and following with the compression screw into the lag screw achieving excellent compression, which could be visualized on both the AP and lateral views.  Final images showed appropriate placement of hardware with  regards to trajectory and length.  The wound was irrigated thoroughly and closed in standard layered fashion using a running 0 Vicryl for the vastus fascia, interrupted #1 Vicryl for the tensor and 2-0 Vicryl and 2-0 nylon for the subcutaneous and skin  respectively.  Montez Morita, PA-C, was present and assisting throughout providing retraction and assisting with closure.  PROGNOSIS:  The patient will be weightbearing as tolerated, though I am concerned about his polysubstance abuse history and the potential for bearing too much weight in the event that his ability to perceive pain may be reduced.  Therapy will continue to  work with him and DVT prophylaxis will be provided by Lovenox while he is in the hospital.  TN/NUANCE  D:12/27/2018 T:12/27/2018 JOB:[id number]

## 2018-12-27 NOTE — Care Management (Signed)
Case manager following, waiting for PT notes.

## 2018-12-27 NOTE — Progress Notes (Addendum)
Orthopedic Trauma Service Progress Note  Patient ID: Sean Hoffman MRN: [id number] DOB/AGE: 1966/04/30 53 y.o.  Subjective:  Doing well Pain tolerable About to get up with therapy   Does note some chronic low back pain which is aggravating him today, he has been in bed for about 36 hours   No cp, no sob, no headache or vision changes  Does admit that he is noncompliant with his BP meds  Lives with his fiance in a single level house He does not have any DME at home   Review of Systems  Constitutional: Negative for chills and fever.  Respiratory: Negative for shortness of breath and wheezing.   Cardiovascular: Negative for chest pain and palpitations.  Gastrointestinal: Negative for abdominal pain, nausea and vomiting.  Neurological: Negative for sensory change.    Objective:   VITALS:   Vitals:   12/26/18 2115 12/27/18 0009 12/27/18 0502 12/27/18 0956  BP: (!) 177/102 (!) 147/87 (!) 150/100 (!) 157/87  Pulse: 66 69 69 71  Resp: 16 17 18 16   Temp: 98.5 F (36.9 C) 97.6 F (36.4 C) 97.9 F (36.6 C) 97.8 F (36.6 C)  TempSrc: Oral Oral Oral Oral  SpO2: 100% 95% 98% 97%  Weight:      Height:        Estimated body mass index is 23.06 kg/m as calculated from the following:   Height as of this encounter: 6' (1.829 m).   Weight as of this encounter: 77.1 kg.   Intake/Output      04/06 0701 - 04/07 0700 04/07 0701 - 04/08 0700   P.O. 530    Other 500    Total Intake(mL/kg) 1030 (13.4)    Urine (mL/kg/hr) 550 175 (0.7)   Blood 75    Total Output 625 175   Net +405 -175          LABS  Results for orders placed or performed during the hospital encounter of 12/26/18 (from the past 24 hour(s))  CBC     Status: Abnormal   Collection Time: 12/26/18 10:49 PM  Result Value Ref Range   WBC 15.1 (H) 4.0 - 10.5 K/uL   RBC 4.91 4.22 - 5.81 MIL/uL   Hemoglobin 13.7 13.0 - 17.0 g/dL   HCT 35.4  56.2 - 56.3 %   MCV 85.3 80.0 - 100.0 fL   MCH 27.9 26.0 - 34.0 pg   MCHC 32.7 30.0 - 36.0 g/dL   RDW 89.3 73.4 - 28.7 %   Platelets 264 150 - 400 K/uL   nRBC 0.0 0.0 - 0.2 %  Creatinine, serum     Status: None   Collection Time: 12/26/18 10:49 PM  Result Value Ref Range   Creatinine, Ser 1.01 0.61 - 1.24 mg/dL   GFR calc non Af Amer >60 >60 mL/min   GFR calc Af Amer >60 >60 mL/min  CBC     Status: Abnormal   Collection Time: 12/27/18  6:23 AM  Result Value Ref Range   WBC 12.7 (H) 4.0 - 10.5 K/uL   RBC 4.61 4.22 - 5.81 MIL/uL   Hemoglobin 12.9 (L) 13.0 - 17.0 g/dL   HCT 68.1 15.7 - 26.2 %   MCV 85.9 80.0 - 100.0 fL   MCH 28.0 26.0 - 34.0 pg   MCHC 32.6 30.0 -  36.0 g/dL   RDW 16.114.2 09.611.5 - 04.515.5 %   Platelets 244 150 - 400 K/uL   nRBC 0.0 0.0 - 0.2 %  Comprehensive metabolic panel     Status: Abnormal   Collection Time: 12/27/18  6:23 AM  Result Value Ref Range   Sodium 135 135 - 145 mmol/L   Potassium 4.0 3.5 - 5.1 mmol/L   Chloride 100 98 - 111 mmol/L   CO2 25 22 - 32 mmol/L   Glucose, Bld 190 (H) 70 - 99 mg/dL   BUN 8 6 - 20 mg/dL   Creatinine, Ser 4.090.98 0.61 - 1.24 mg/dL   Calcium 8.8 (L) 8.9 - 10.3 mg/dL   Total Protein 6.6 6.5 - 8.1 g/dL   Albumin 3.1 (L) 3.5 - 5.0 g/dL   AST 28 15 - 41 U/L   ALT 17 0 - 44 U/L   Alkaline Phosphatase 68 38 - 126 U/L   Total Bilirubin 0.5 0.3 - 1.2 mg/dL   GFR calc non Af Amer >60 >60 mL/min   GFR calc Af Amer >60 >60 mL/min   Anion gap 10 5 - 15     PHYSICAL EXAM:   Gen: sitting up in bed, NAD, appears well, pleasant Lungs: breathing unlabored Cardiac: regular  Ext:       Right Lower Extremity   Dressing intact, scant drainage  BKA stump intact  Ext war   Good knee ROM   Swelling minimal   Assessment/Plan: 1 Day Post-Op   Principal Problem:   Fracture of femoral neck, right (HCC) Active Problems:   Alcohol use disorder, severe, dependence (HCC)   Cocaine use disorder, moderate, dependence (HCC)   Tobacco use  disorder   Substance induced mood disorder (HCC)   Hepatitis C antibody positive in blood   Anti-infectives (From admission, onward)   Start     Dose/Rate Route Frequency Ordered Stop   12/26/18 2330  ceFAZolin (ANCEF) IVPB 1 g/50 mL premix     1 g 100 mL/hr over 30 Minutes Intravenous Every 6 hours 12/26/18 2127 12/27/18 1729   12/26/18 1700  ceFAZolin (ANCEF) IVPB 2g/100 mL premix     2 g 200 mL/hr over 30 Minutes Intravenous On call to O.R. 12/26/18 1602 12/26/18 1725    .  POD/HD#: 281  53 year old black male with history of polysubstance abuse as well as right BKA with acute right basicervical femoral neck fracture with intertrochanteric extension   -Right basicervical femoral neck fracture with intertrochanteric extension s/p ORIF with [facility]              TDWB R leg (no prosthetic use for about 6 weeks)  ROM as tolerated   Dressing changes tomorrow   Ice prn   Therapy evals    - Pain management:             appears to be in good control with current regimen   Continue to monitor    - ABL anemia/Hemodynamics             H/H looks good    HTN   Pt admits to being non-compliant with meds   Resume home meds and will increase his norvasc to 5 mg daily    Prn hydralazine available   Blood glucose a little high this am, could be stress response. Will check a1c and urinalysis. If diabetes/proteinuria noted will likely start on ace inhibitor as well. If these labs are normal could consider diuretic to help optimize control  but I think this is more of a compliance issue    Discussed with the pt the importance of taking his meds and following up with his PCP as uncontrolled HTN could lead to things like stroke, renal failure, etc     Does not appear to be in any acute distress as HR and RR/o2 sats look very good   - Medical issues              HTN                         as above                Nicotine dependence                         No nicotine products whatsoever, due to  negative impact related to bone healing and wound healing.                           Polysubstance abuse    Cocaine use   EtOH    CIWA protocol    + hepatitis C ab    Acute hepatitis panel reported this am. Checked due to active cocaine use   Check reflexive Hep C RNA   Was not able to discuss with pt in private today. Will talk to him tomorrow    - DVT/PE prophylaxis:             Lovenox x 4 weeks    - ID:              periop abx                          Ancef    - Metabolic Bone Disease:             Check vitamin D- pending              Low energy fracture indicative of poor bone quality   Vitamin d and vitamin c started    - FEN/GI prophylaxis/Foley/Lines:  Reg diet               IVF              protonix    - Impediments to fracture healing:             Polysubstance abuse             Nicotine use             Alcohol use             Suspected poor bone density and nutrition    - Dispo:             therapy   Suspect he will be ready to dc home Thursday  Want to ensure adequate pain control and good BP control    Mearl Latin, PA-C [phone] (C) 12/27/2018, 10:20 AM  [facility] [address] [place] [address] [phone] Collier Bullock (F)

## 2018-12-27 NOTE — Evaluation (Signed)
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 Other PMH: HTN, bipolar, schizophrenia   Clinical Impression   This 53 y/o male presents with the above. PTA pt reports he intermittently used [facility] for functional mobility, reports he received assist from an aide for ADL completion. Pt with limitations due to pain in RLE and increased pain/spasms in back. Pt performing room level functional mobility using RW with overall minguard assist, though requires increased time for transitions sitting<>standing to decrease onset of pain. He currently requires setup/minguard assist for seated UB ADL, minA for LB ADL. Pt will benefit from continued acute OT services and recommend follow up [facility] services after discharge to maximize his safety and independence with ADL and mobility. Will follow.     Follow Up Recommendations  Home health OT;Supervision - Intermittent    Equipment Recommendations  Tub/shower bench           Precautions / Restrictions Precautions Precautions: Fall Precaution Comments: pt reports that he has fallen several times in the past 6 mos, also reports that he hops around the house without his prosthesis without an AD Restrictions Weight Bearing Restrictions: Yes RLE Weight Bearing: Touchdown weight bearing Other Position/Activity Restrictions: he can't don his prosthesis with TDWB status, educated on this      Mobility Bed Mobility Overal bed mobility: Needs Assistance Bed Mobility: Sit to Sidelying;Rolling Rolling: Min assist       Sit to sidelying: Mod assist General bed mobility comments: assist for LEs onto EOB, utilized log roll technique due to  increased back pain/spasms, increased assist for rolling to ensure safety with RLE  Transfers Overall transfer level: Needs assistance Equipment used: Rolling walker (2 wheeled) Transfers: Sit to/from Stand Sit to Stand: Min assist         General transfer comment: vc's for hand placement, min A to steady; increased time and effort to rise and descend due to back pain, elevated EOB when transitioning to sitting to aide in pain     Balance Overall balance assessment: Mild deficits observed, not formally tested                                         ADL either performed or assessed with clinical judgement   ADL Overall ADL's : Needs assistance/impaired Eating/Feeding: Modified independent;Sitting   Grooming: Set up;Sitting   Upper Body Bathing: Set up;Sitting   Lower Body Bathing: Minimal assistance;Sit to/from stand   Upper Body Dressing : Set up;Sitting   Lower Body Dressing: Minimal assistance;Sit to/from stand   Toilet Transfer: Min guard;Minimal assistance;Ambulation;RW Toilet Transfer Details (indicate cue type and reason): simulated via transfer recliner to EOB, room level mobility Toileting- Clothing Manipulation and Hygiene: Minimal assistance;Sit to/from stand       Functional mobility during ADLs: Min guard;Minimal assistance;Rolling walker General ADL Comments: pt with limitations due to pain today, requires increased time to perform mobility/functional tasks     Vision         Perception     Praxis      Pertinent Vitals/Pain Pain Assessment: Faces Faces Pain Scale: Hurts whole lot  Pain Location: RLE and back (mostly back) Pain Descriptors / Indicators: Aching;Sore;Spasm Pain Intervention(s): Limited activity within patient's tolerance;Monitored during session;Repositioned     Hand Dominance     Extremity/Trunk Assessment Upper Extremity Assessment Upper Extremity Assessment: Overall WFL for tasks assessed   Lower Extremity  Assessment Lower Extremity Assessment: Defer to PT evaluation   Cervical / Trunk Assessment Cervical / Trunk Assessment: Normal   Communication Communication Communication: No difficulties   Cognition Arousal/Alertness: Awake/alert Behavior During Therapy: WFL for tasks assessed/performed Overall Cognitive Status: Within Functional Limits for tasks assessed                                     General Comments       Exercises     Shoulder Instructions      Home Living Family/patient expects to be discharged to:: Private residence Living Arrangements: Spouse/significant other Available Help at Discharge: Family;Available 24 hours/day Type of Home: House Home Access: Stairs to enter Entergy CorporationEntrance Stairs-Number of Steps: 1 Entrance Stairs-Rails: None Home Layout: One level     Bathroom Shower/Tub: Chief Strategy OfficerTub/shower unit   Bathroom Toilet: Standard     Home Equipment: Information systems managerhower seat;Toilet riser          Prior Functioning/Environment Level of Independence: Needs assistance  Gait / Transfers Assistance Needed: pt reports intermittent use of SPC for mobility ADL's / Homemaking Assistance Needed: pt reports he had an aide who assists with ADL/iADL completion   Comments: noted pt reporting variable information to PT/OT in different sessions        OT Problem List: Decreased strength;Decreased range of motion;Decreased activity tolerance;Impaired balance (sitting and/or standing);Pain;Decreased knowledge of use of DME or AE      OT Treatment/Interventions: Self-care/ADL training;Therapeutic exercise;Neuromuscular education;DME and/or AE instruction;Therapeutic activities;Patient/family education;Balance training    OT Goals(Current goals can be found in the care plan section) Acute Rehab OT Goals Patient Stated Goal: return home OT Goal Formulation: With patient Time For Goal Achievement: 01/10/19 Potential to Achieve Goals: Good  OT Frequency: Min 2X/week    Barriers to D/C:            Co-evaluation              AM-PAC OT "6 Clicks" Daily Activity     Outcome Measure Help from another person eating meals?: None Help from another person taking care of personal grooming?: A Little Help from another person toileting, which includes using toliet, bedpan, or urinal?: A Little Help from another person bathing (including washing, rinsing, drying)?: A Little Help from another person to put on and taking off regular upper body clothing?: A Little Help from another person to put on and taking off regular lower body clothing?: A Lot 6 Click Score: 18   End of Session Equipment Utilized During Treatment: Gait belt;Rolling walker Nurse Communication: Mobility status  Activity Tolerance: Patient tolerated treatment well Patient left: in bed;with call bell/phone within reach;with bed alarm set  OT Visit Diagnosis: Unsteadiness on feet (R26.81);Other abnormalities of gait and mobility (R26.89);Pain Pain - Right/Left: Right Pain - part of body: Hip(and back)                Time: 3086-57841422-1448 OT Time Calculation (min): 26 min Charges:  OT General Charges $OT Visit: 1 Visit OT Evaluation $OT Eval Moderate Complexity: 1 Mod OT Treatments $Self Care/Home Management : 8-22 mins  Marcy SirenBreanna Benino Korinek, OT Supplemental Rehabilitation Services Pager  [phone] Office [phone]   Orlando Penner 12/27/2018, 3:53 PM

## 2018-12-27 NOTE — Progress Notes (Signed)
Orthopedic Tech Progress Note Patient Details:  Sean Hoffman 05/17/1966 [id number]  Ortho Devices Ortho Device/Splint Location: Trapeze bar Ortho Device/Splint Interventions: Application   Post Interventions Patient Tolerated: Well Instructions Provided: Care of device   Saul Fordyce 12/27/2018, 10:47 AM

## 2018-12-27 NOTE — TOC Initial Note (Signed)
Transition of Care [facility]) - Initial/Assessment Note    Patient Details  Name: Sean Hoffman MRN: [id number] Date of Birth: 06-10-1966  [facility] CM/SW Contact:    Doy Hutching, LCSWA Phone Number: 12/27/2018, 12:39 PM  Clinical Narrative:                 CSW spoke with pt at bedside, introduced self, role, reason for visit. Pt pleasant, from [place] where he lives with his significant other of 6 years. He has family that is also supportive and eager to get home. He states he has an aide through [employer]. He may call the company and see if he can get more hours of aide but otherwise feels his partner provides good support.   CSW discussed substance use with pt who states he had not had substances in a long time and understands he over did it. He declines offer of resources stating "I know the best resource- and it is not going to the store to buy it." Let pt know we would follow as needed- all questions answered.   Expected Discharge Plan: Home/Self Care Barriers to Discharge: No Barriers Identified   Patient Goals and CMS Choice Patient states their goals for this hospitalization and ongoing recovery are:: to get home asap      Expected Discharge Plan and Services Expected Discharge Plan: Home/Self Care   Living arrangements for the past 2 months: Single Family Home                  Prior Living Arrangements/Services Living arrangements for the past 2 months: Single Family Home Lives with:: Significant Other Patient language and need for interpreter reviewed:: No Do you feel safe going back to the place where you live?: Yes      Need for Family Participation in Patient Care: Yes (Comment)(assistance with mobility as needed) Care giver support system in place?: Yes (comment)(home aide and girlfriend) Current home services: Homehealth aide, DME Criminal Activity/Legal Involvement Pertinent to Current Situation/Hospitalization: No - Comment as  needed  Activities of Daily Living Home Assistive Devices/Equipment: Cane (specify quad or straight), Walker (specify type) ADL Screening (condition at time of admission) Patient's cognitive ability adequate to safely complete daily activities?: Yes Is the patient deaf or have difficulty hearing?: No Does the patient have difficulty seeing, even when wearing glasses/contacts?: No Does the patient have difficulty concentrating, remembering, or making decisions?: No Patient able to express need for assistance with ADLs?: Yes Does the patient have difficulty dressing or bathing?: Yes Independently performs ADLs?: Yes (appropriate for developmental age) Does the patient have difficulty walking or climbing stairs?: No Weakness of Legs: None Weakness of Arms/Hands: None  Permission Sought/Granted Permission sought to share information with : Family Supports    Share Information with NAME: Georganna Skeans  Permission granted to share info w AGENCY: [employer]  Permission granted to share info w Relationship: sister  Permission granted to share info w Contact Information: [phone]  Emotional Assessment Appearance:: Appears stated age Attitude/Demeanor/Rapport: Gracious, Charismatic, Engaged Affect (typically observed): Adaptable, Accepting, Appropriate Orientation: : Oriented to Self, Oriented to Place, Oriented to  Time, Oriented to Situation Alcohol / Substance Use: Alcohol Use, Illicit Drugs Psych Involvement: No (comment)  Admission diagnosis:  Right Femoral Neck Fracture. Patient Active Problem List   Diagnosis Date Noted  . Hepatitis C antibody positive in blood 12/27/2018  . Closed right hip fracture (HCC) 12/26/2018  . Fracture of femoral neck, right (HCC) 12/26/2018  .  Substance induced mood disorder (HCC) 10/09/2016  . HTN (hypertension) 08/06/2016  . Alcohol use disorder, severe, dependence (HCC) 08/05/2016  . Cocaine use disorder, moderate, dependence (HCC) 08/05/2016   . Tobacco use disorder 08/05/2016  . Severe recurrent major depression without psychotic features (HCC) 08/04/2016   PCP:  Patient, No Pcp Per Pharmacy:   [facility] [phone] [facility] [place], [place] - [address] [facility] [address] [place] [id number] Phone: [phone] Fax: [phone]     Social Determinants of Health (SDOH) Interventions    Readmission Risk Interventions Readmission Risk Prevention Plan 12/27/2018  Transportation Screening Complete  PCP or Specialist Appt within 5-7 Days Complete  Home Care Screening Complete  Medication Review (RN CM) Complete  Some recent data might be hidden

## 2018-12-28 ENCOUNTER — Encounter (HOSPITAL_COMMUNITY): Payer: Self-pay | Admitting: Orthopedic Surgery

## 2018-12-28 DIAGNOSIS — Z9114 Patient's other noncompliance with medication regimen: Secondary | ICD-10-CM

## 2018-12-28 DIAGNOSIS — Z91148 Patient's other noncompliance with medication regimen for other reason: Secondary | ICD-10-CM

## 2018-12-28 HISTORY — DX: Patient's other noncompliance with medication regimen: Z91.14

## 2018-12-28 HISTORY — DX: Patient's other noncompliance with medication regimen for other reason: Z91.148

## 2018-12-28 LAB — CALCITRIOL (1,25 DI-OH VIT D): Vit D, 1,25-Dihydroxy: 58.5 pg/mL (ref 19.9–79.3)

## 2018-12-28 LAB — CBC
HCT: 39.4 % (ref 39.0–52.0)
Hemoglobin: 12.5 g/dL — ABNORMAL LOW (ref 13.0–17.0)
MCH: 27.1 pg (ref 26.0–34.0)
MCHC: 31.7 g/dL (ref 30.0–36.0)
MCV: 85.5 fL (ref 80.0–100.0)
Platelets: 254 10*3/uL (ref 150–400)
RBC: 4.61 MIL/uL (ref 4.22–5.81)
RDW: 14.1 % (ref 11.5–15.5)
WBC: 11.2 10*3/uL — ABNORMAL HIGH (ref 4.0–10.5)
nRBC: 0 % (ref 0.0–0.2)

## 2018-12-28 LAB — GLUCOSE, CAPILLARY: Glucose-Capillary: 100 mg/dL — ABNORMAL HIGH (ref 70–99)

## 2018-12-28 LAB — VITAMIN D 25 HYDROXY (VIT D DEFICIENCY, FRACTURES): Vit D, 25-Hydroxy: 10.2 ng/mL — ABNORMAL LOW (ref 30.0–100.0)

## 2018-12-28 MED ORDER — HYDROCODONE-ACETAMINOPHEN 7.5-325 MG PO TABS: 1.0000 | ORAL_TABLET | Freq: Four times a day (QID) | ORAL | 0 refills | Status: DC | PRN

## 2018-12-28 MED ORDER — ENOXAPARIN (LOVENOX) PATIENT EDUCATION KIT: PACK | Freq: Once | Status: DC

## 2018-12-28 MED ORDER — ASCORBIC ACID 1000 MG PO TABS: 1000.0000 mg | ORAL_TABLET | Freq: Every day | ORAL | 0 refills | Status: DC

## 2018-12-28 MED ORDER — ENOXAPARIN SODIUM 40 MG/0.4ML ~~LOC~~ SOLN: 40.0000 mg | SUBCUTANEOUS | 0 refills | Status: DC

## 2018-12-28 MED ORDER — AMLODIPINE BESYLATE 5 MG PO TABS: 5.0000 mg | ORAL_TABLET | Freq: Every day | ORAL | 1 refills | Status: DC

## 2018-12-28 MED ORDER — ADULT MULTIVITAMIN W/MINERALS CH: 1.0000 | ORAL_TABLET | Freq: Every day | ORAL | Status: DC

## 2018-12-28 MED ORDER — METHOCARBAMOL 500 MG PO TABS: 500.0000 mg | ORAL_TABLET | Freq: Three times a day (TID) | ORAL | 0 refills | Status: DC | PRN

## 2018-12-28 MED ORDER — VITAMIN D 125 MCG (5000 UT) PO CAPS: 1.0000 | ORAL_CAPSULE | Freq: Every day | ORAL | 3 refills | Status: DC

## 2018-12-28 MED ORDER — ENOXAPARIN (LOVENOX) PATIENT EDUCATION KIT: 1.0000 | PACK | Freq: Once | 0 refills | Status: AC

## 2018-12-28 MED ORDER — ACETAMINOPHEN 500 MG PO TABS: 500.0000 mg | ORAL_TABLET | Freq: Three times a day (TID) | ORAL | 0 refills | Status: DC

## 2018-12-28 MED ORDER — DOCUSATE SODIUM 100 MG PO CAPS: 100.0000 mg | ORAL_CAPSULE | Freq: Two times a day (BID) | ORAL | 0 refills | Status: DC

## 2018-12-28 NOTE — Discharge Instructions (Signed)
Orthopaedic Trauma Service Discharge Instructions   General Discharge Instructions  WEIGHT BEARING STATUS: Touchdown weightbearing Right leg x 6 weeks  RANGE OF MOTION/ACTIVITY: unrestricted range of motion R hip and knee   Wound Care: daily wound care starting on 12/30/2018. See below   Discharge Wound Care Instructions  Do NOT apply any ointments, solutions or lotions to pin sites or surgical wounds.  These prevent needed drainage and even though solutions like hydrogen peroxide kill bacteria, they also damage cells lining the pin sites that help fight infection.  Applying lotions or ointments can keep the wounds moist and can cause them to breakdown and open up as well. This can increase the risk for infection. When in doubt call the office.  Surgical incisions should be dressed daily.  If any drainage is noted, use one layer of adaptic, then gauze, Kerlix, and an ace wrap.  Once the incision is completely dry and without drainage, it may be left open to air out.  Showering may begin 36-48 hours later.  Cleaning gently with soap and water.  Traumatic wounds should be dressed daily as well.    One layer of adaptic, gauze, Kerlix, then ace wrap.  The adaptic can be discontinued once the draining has ceased    If you have a wet to dry dressing: wet the gauze with saline the squeeze as much saline out so the gauze is moist (not soaking wet), place moistened gauze over wound, then place a dry gauze over the moist one, followed by Kerlix wrap, then ace wrap.  DVT/PE prophylaxis: Lovenox 40 mg subcutaneous injection daily x 28 days   Diet: as you were eating previously.  Can use over the counter stool softeners and bowel preparations, such as Miralax, to help with bowel movements.  Narcotics can be constipating.  Be sure to drink plenty of fluids  PAIN MEDICATION USE AND EXPECTATIONS  You have likely been given narcotic medications to help control your pain.  After a traumatic event  that results in an fracture (broken bone) with or without surgery, it is ok to use narcotic pain medications to help control one's pain.  We understand that everyone responds to pain differently and each individual patient will be evaluated on a regular basis for the continued need for narcotic medications. Ideally, narcotic medication use should last no more than 6-8 weeks (coinciding with fracture healing).   As a patient it is your responsibility as well to monitor narcotic medication use and report the amount and frequency you use these medications when you come to your office visit.   We would also advise that if you are using narcotic medications, you should take a dose prior to therapy to maximize you participation.  IF YOU ARE ON NARCOTIC MEDICATIONS IT IS NOT PERMISSIBLE TO OPERATE A MOTOR VEHICLE (MOTORCYCLE/CAR/TRUCK/MOPED) OR HEAVY MACHINERY DO NOT MIX NARCOTICS WITH OTHER CNS (CENTRAL NERVOUS SYSTEM) DEPRESSANTS SUCH AS ALCOHOL   STOP SMOKING OR USING NICOTINE PRODUCTS!!!!  As discussed nicotine severely impairs your body's ability to heal surgical and traumatic wounds but also impairs bone healing.  Wounds and bone heal by forming microscopic blood vessels (angiogenesis) and nicotine is a vasoconstrictor (essentially, shrinks blood vessels).  Therefore, if vasoconstriction occurs to these microscopic blood vessels they essentially disappear and are unable to deliver necessary nutrients to the healing tissue.  This is one modifiable factor that you can do to dramatically increase your chances of healing your injury.    (This means no smoking,  no nicotine gum, patches, etc)  DO NOT USE NONSTEROIDAL ANTI-INFLAMMATORY DRUGS (NSAID'S)  Using products such as Advil (ibuprofen), Aleve (naproxen), Motrin (ibuprofen) for additional pain control during fracture healing can delay and/or prevent the healing response.  If you would like to take over the counter (OTC) medication, Tylenol (acetaminophen)  is ok.  However, some narcotic medications that are given for pain control contain acetaminophen as well. Therefore, you should not exceed more than 4000 mg of tylenol in a day if you do not have liver disease.  Also note that there are may OTC medicines, such as cold medicines and allergy medicines that my contain tylenol as well.  If you have any questions about medications and/or interactions please ask your doctor/PA or your pharmacist.      ICE AND ELEVATE INJURED/OPERATIVE EXTREMITY  Using ice and elevating the injured extremity above your heart can help with swelling and pain control.  Icing in a pulsatile fashion, such as 20 minutes on and 20 minutes off, can be followed.    Do not place ice directly on skin. Make sure there is a barrier between to skin and the ice pack.    Using frozen items such as frozen peas works well as the conform nicely to the are that needs to be iced.  USE AN ACE WRAP OR TED HOSE FOR SWELLING CONTROL  In addition to icing and elevation, Ace wraps or TED hose are used to help limit and resolve swelling.  It is recommended to use Ace wraps or TED hose until you are informed to stop.    When using Ace Wraps start the wrapping distally (farthest away from the body) and wrap proximally (closer to the body)   Example: If you had surgery on your leg or thing and you do not have a splint on, start the ace wrap at the toes and work your way up to the thigh        If you had surgery on your upper extremity and do not have a splint on, start the ace wrap at your fingers and work your way up to the upper arm  IF YOU ARE IN A SPLINT OR CAST DO NOT REMOVE IT FOR ANY REASON   If your splint gets wet for any reason please contact the office immediately. You may shower in your splint or cast as long as you keep it dry.  This can be done by wrapping in a cast cover or garbage back (or similar)  Do Not stick any thing down your splint or cast such as pencils, money, or hangers to try  and scratch yourself with.  If you feel itchy take benadryl as prescribed on the bottle for itching  IF YOU ARE IN A CAM BOOT (BLACK BOOT)  You may remove boot periodically. Perform daily dressing changes as noted below.  Wash the liner of the boot regularly and wear a sock when wearing the boot. It is recommended that you sleep in the boot until told otherwise  CALL THE OFFICE WITH ANY QUESTIONS OR CONCERNS: [phone]

## 2018-12-28 NOTE — Progress Notes (Signed)
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 Other PMH: HTN, bipolar, schizophrenia   OT comments  Pt progressing towards established OT goals. Pt donning shirt, pants, and left sock/shoe with Min Guard A for safety in standing. Pt verbalizing understanding to not wear prosthesis due to weight bearing precautions. Answering questions in preparation for dc later today. Continue to recommend dc home with HHOT.   Follow Up Recommendations  Home health OT;Supervision - Intermittent    Equipment Recommendations  Tub/shower bench    Recommendations for Other Services      Precautions / Restrictions Precautions Precautions: Fall Precaution Comments: pt reports that he has fallen several times in the past 6 mos, also reports that he hops around the house without his prosthesis without an AD Restrictions Weight Bearing Restrictions: Yes RLE Weight Bearing: Touchdown weight bearing Other Position/Activity Restrictions: he can't don his prosthesis with TDWB status, educated on this       Mobility Bed Mobility Overal bed mobility: Modified Independent Bed Mobility: Supine to Sit           General bed mobility comments: increased time and effort; use of rail  Transfers Overall transfer level: Needs assistance Equipment used: Rolling walker (2 wheeled) Transfers: Sit to/from Stand Sit to Stand: Min guard         General transfer comment: cues for safe hand placement and positioning prior to stand; min guard for safety    Balance Overall balance assessment: Mild deficits observed, not formally tested                                         ADL either performed or assessed with clinical judgement   ADL Overall ADL's : Needs assistance/impaired                 Upper Body Dressing : Set up;Sitting   Lower Body Dressing: Min guard;Sit to/from stand Lower Body Dressing Details (indicate cue type and reason): Pt donning pants and left socks/shoe. Toilet Transfer: Min guard;Ambulation;RW(simulated to recliner)           Functional mobility during ADLs: Min guard;Rolling walker General ADL Comments: Pt dressing in preparation for dc and demonstrating increased balance. Min Guard A for Scientist, product/process development      Cognition Arousal/Alertness: Awake/alert Behavior During Therapy: WFL for tasks assessed/performed Overall Cognitive Status: Within Functional Limits for tasks assessed                                          Exercises     Shoulder Instructions       General Comments pt given HEP handout and reviewed    Pertinent Vitals/ Pain       Pain Assessment: Faces Pain Score: 5  Faces Pain Scale: Hurts little more Pain Location: R LE with mobility; 3/10 at rest Pain Descriptors / Indicators: Sore;Guarding;Grimacing;Tightness Pain Intervention(s): Monitored during session;Limited activity within  patient's tolerance;Repositioned  Home Living                                          Prior Functioning/Environment              Frequency  Min 2X/week        Progress Toward Goals  OT Goals(current goals can now be found in the care plan section)  Progress towards OT goals: Progressing toward goals  Acute Rehab OT Goals Patient Stated Goal: return home OT Goal Formulation: With patient Time For Goal Achievement: 01/10/19 Potential to Achieve Goals: Good ADL Goals Pt Will Perform Grooming: with modified independence;sitting;standing(sitting vs standing) Pt Will Perform Lower Body Bathing: sit to/from stand;sitting/lateral  leans;with modified independence Pt Will Perform Lower Body Dressing: sit to/from stand;sitting/lateral leans;with modified independence Pt Will Transfer to Toilet: ambulating;bedside commode;with modified independence(BSC over toilet) Pt Will Perform Toileting - Clothing Manipulation and hygiene: sit to/from stand;with modified independence  Plan Discharge plan remains appropriate    Co-evaluation                 AM-PAC OT "6 Clicks" Daily Activity     Outcome Measure   Help from another person eating meals?: None Help from another person taking care of personal grooming?: A Little Help from another person toileting, which includes using toliet, bedpan, or urinal?: A Little Help from another person bathing (including washing, rinsing, drying)?: A Little Help from another person to put on and taking off regular upper body clothing?: A Little Help from another person to put on and taking off regular lower body clothing?: A Lot 6 Click Score: 18    End of Session Equipment Utilized During Treatment: Rolling walker  OT Visit Diagnosis: Unsteadiness on feet (R26.81);Other abnormalities of gait and mobility (R26.89);Pain Pain - Right/Left: Right Pain - part of body: Hip(and back)   Activity Tolerance Patient tolerated treatment well   Patient Left with call bell/phone within reach;in chair;with nursing/sitter in room   Nurse Communication Mobility status        Time: 8119-14781037-1055 OT Time Calculation (min): 18 min  Charges: OT General Charges $OT Visit: 1 Visit OT Treatments $Self Care/Home Management : 8-22 mins  Rivers Gassmann MSOT, OTR/L Acute Rehab Pager: [phone] Office: [phone]   Theodoro GristCharis M Cadyn Fann 12/28/2018, 12:36 PM

## 2018-12-28 NOTE — Progress Notes (Signed)
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 Other PMH: HTN, bipolar, schizophrenia    PT Comments    Patient seen for mobility progression. Pt is overall min guard for OOB mobility. Pt tolerated gait and stair training well. Current plan remains appropriate.    Follow Up Recommendations  No PT follow up     Equipment Recommendations  Rolling walker with 5" wheels    Recommendations for Other Services       Precautions / Restrictions Precautions Precautions: Fall Precaution Comments: pt reports that he has fallen several times in the past 6 mos, also reports that he hops around the house without his prosthesis without an AD Restrictions Weight Bearing Restrictions: Yes RLE Weight Bearing: Touchdown weight bearing Other Position/Activity Restrictions: he can't don his prosthesis with TDWB status, educated on this    Mobility  Bed Mobility Overal bed mobility: Modified Independent Bed Mobility: Supine to Sit;Sit to Supine           General bed mobility comments: increased time and effort; use of rail  Transfers Overall transfer level: Needs assistance Equipment used: Rolling walker (2 wheeled) Transfers: Sit to/from Stand Sit to Stand: Min guard         General transfer comment: cues for safe hand placement and positioning prior to stand; min guard for safety  Ambulation/Gait Ambulation/Gait assistance: Min guard Gait Distance (Feet): 75 Feet Assistive device: Rolling walker (2 wheeled) Gait Pattern/deviations: Step-to pattern Gait velocity: decreased   General Gait Details: cues for maintaining safe proximty to RW; grossly steady  gait   Stairs Stairs: Yes Stairs assistance: Min guard Stair Management: No rails;Step to pattern;Backwards Number of Stairs: (single step X 2 trials) General stair comments: cues for sequencing and technique   Wheelchair Mobility    Modified Rankin (Stroke Patients Only)       Balance Overall balance assessment: Mild deficits observed, not formally tested                                          Cognition Arousal/Alertness: Awake/alert Behavior During Therapy: WFL for tasks assessed/performed Overall Cognitive Status: Within Functional Limits for tasks assessed                                        Exercises      General Comments General comments (skin integrity, edema, etc.): pt given HEP handout and reviewed      Pertinent Vitals/Pain Pain Assessment: 0-10 Pain Score: 5  Pain Location: R LE with mobility; 3/10 at rest Pain Descriptors / Indicators: Sore;Guarding;Grimacing;Tightness Pain Intervention(s): Limited activity within patient's tolerance;Monitored during session;Repositioned    Home Living                      Prior Function            PT Goals (current goals can now be found in the care plan section) Acute Rehab PT Goals Patient Stated Goal: return home Progress towards PT goals: Progressing  toward goals    Frequency    Min 5X/week      PT Plan Current plan remains appropriate    Co-evaluation              AM-PAC PT "6 Clicks" Mobility   Outcome Measure  Help needed turning from your back to your side while in a flat bed without using bedrails?: None Help needed moving from lying on your back to sitting on the side of a flat bed without using bedrails?: A Little Help needed moving to and from a bed to a chair (including a wheelchair)?: A Little Help needed standing up from a chair using your arms (e.g., wheelchair or bedside chair)?: A Little Help needed to walk in hospital room?: A  Little Help needed climbing 3-5 steps with a railing? : A Little 6 Click Score: 19    End of Session Equipment Utilized During Treatment: Gait belt Activity Tolerance: Patient tolerated treatment well Patient left: with call bell/phone within reach;in bed Nurse Communication: Mobility status PT Visit Diagnosis: Unsteadiness on feet (R26.81);Pain;History of falling (Z91.81) Pain - Right/Left: Right Pain - part of body: Leg     Time: [id number] PT Time Calculation (min) (ACUTE ONLY): 35 min  Charges:  $Gait Training: 23-37 mins                     Erline LevineKellyn Kelani Robart, PTA Acute Rehabilitation Services Pager: [phone] Office: [phone]     Carolynne EdouardKellyn R Houa Ackert 12/28/2018, 9:45 AM

## 2018-12-28 NOTE — Progress Notes (Signed)
[name] to be D/C'd  per MD order. Discussed with the patient and all questions fully answered.  VSS, Skin clean, dry and intact without evidence of skin break down, no evidence of skin tears noted.  IV catheter discontinued intact. Site without signs and symptoms of complications. Dressing and pressure applied.  An After Visit Summary was printed and given to the patient. Patient received prescription.  D/c education completed with patient/family including follow up instructions, medication list, d/c activities limitations if indicated, with other d/c instructions as indicated by MD - patient able to verbalize understanding, all questions fully answered.   Patient instructed to return to ED, call 911, or call MD for any changes in condition.   Patient to be escorted via WC, and D/C home via private auto.

## 2018-12-28 NOTE — Discharge Summary (Signed)
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              Vitamin d and vitamin c  DEXA as outpt    - FEN/GI prophylaxis/Foley/Lines:             Reg diet      - Impediments to fracture healing:             Polysubstance abuse             Nicotine use             Alcohol use             Suspected poor bone density and nutrition    - Dispo:             dc home today              Follow up with ortho in 10-14 days   Signed:  Jari Pigg, PA-C [phone] (C) 12/28/2018, 9:49 AM  [facility] [place] [address] [phone] Domingo Sep (F)
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
## 2018-12-30 LAB — HCV RNA QUANT RFLX ULTRA OR GENOTYP
HCV RNA Qnt(log copy/mL): UNDETERMINED log10 IU/mL
HepC Qn: NOT DETECTED IU/mL
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
## 2019-01-09 ENCOUNTER — Encounter: Payer: Self-pay | Admitting: Critical Care Medicine

## 2019-01-09 ENCOUNTER — Inpatient Hospital Stay: Payer: Self-pay | Admitting: Critical Care Medicine

## 2019-01-09 ENCOUNTER — Ambulatory Visit: Payer: Medicaid Other | Attending: Critical Care Medicine | Admitting: Critical Care Medicine

## 2019-01-09 ENCOUNTER — Other Ambulatory Visit: Payer: Self-pay

## 2019-01-09 DIAGNOSIS — F1020 Alcohol dependence, uncomplicated: Secondary | ICD-10-CM | POA: Diagnosis not present

## 2019-01-09 DIAGNOSIS — I10 Essential (primary) hypertension: Secondary | ICD-10-CM

## 2019-01-09 DIAGNOSIS — F1994 Other psychoactive substance use, unspecified with psychoactive substance-induced mood disorder: Secondary | ICD-10-CM

## 2019-01-09 DIAGNOSIS — S72001D Fracture of unspecified part of neck of right femur, subsequent encounter for closed fracture with routine healing: Secondary | ICD-10-CM | POA: Diagnosis not present

## 2019-01-09 DIAGNOSIS — F332 Major depressive disorder, recurrent severe without psychotic features: Secondary | ICD-10-CM

## 2019-01-09 DIAGNOSIS — F17200 Nicotine dependence, unspecified, uncomplicated: Secondary | ICD-10-CM

## 2019-01-09 DIAGNOSIS — F1420 Cocaine dependence, uncomplicated: Secondary | ICD-10-CM

## 2019-01-09 DIAGNOSIS — R768 Other specified abnormal immunological findings in serum: Secondary | ICD-10-CM

## 2019-01-09 DIAGNOSIS — E559 Vitamin D deficiency, unspecified: Secondary | ICD-10-CM

## 2019-01-09 MED ORDER — DOCUSATE SODIUM 100 MG PO CAPS: 100.0000 mg | ORAL_CAPSULE | Freq: Two times a day (BID) | ORAL | 0 refills | Status: DC

## 2019-01-09 MED ORDER — CYCLOBENZAPRINE HCL 10 MG PO TABS: 10.0000 mg | ORAL_TABLET | Freq: Three times a day (TID) | ORAL | 0 refills | Status: DC | PRN

## 2019-01-12 ENCOUNTER — Ambulatory Visit: Payer: Medicaid Other | Attending: Family Medicine | Admitting: Licensed Clinical Social Worker

## 2019-01-12 DIAGNOSIS — F331 Major depressive disorder, recurrent, moderate: Secondary | ICD-10-CM

## 2019-01-13 ENCOUNTER — Other Ambulatory Visit: Payer: Self-pay

## 2019-01-17 NOTE — BH Specialist Note (Signed)
Integrated Behavioral Health Visit via Telemedicine (Telephone)  01/12/2019 Sean Hoffman [id number]   Session Start time: 5:30 PM  Session End time: 5:50 PM Total time: 20 minutes  Referring Provider: Dr. Delford Field Type of Visit: Telephonic Patient location: [facility] Provider location: Office All persons participating in visit: Pt  Confirmed patient's address: Yes  Confirmed patient's phone number: Yes  Any changes to demographics: No   Confirmed patient's insurance: Yes  Any changes to patient's insurance: No   Discussed confidentiality: Yes    The following statements were read to the patient and/or legal guardian that are established with the [facility] Provider.  "The purpose of this phone visit is to provide behavioral health care while limiting exposure to the coronavirus (COVID19).  There is a possibility of technology failure and discussed alternative modes of communication if that failure occurs."  "By engaging in this telephone visit, you consent to the provision of healthcare.  Additionally, you authorize for your insurance to be billed for the services provided during this telephone visit."   Patient and/or legal guardian consented to telephone visit: Yes   PRESENTING CONCERNS: Patient and/or family reports the following symptoms/concerns: Pt reports difficulty adjusting to not being as independent while healing from a broken hip Duration of problem: 1 month; Severity of problem: moderate  STRENGTHS (Protective Factors/Coping Skills): Pt has good insight Pt has a strong support system Pt is connected with community agencies  GOALS ADDRESSED: Patient will: 1.  Reduce symptoms of: anxiety and depression  2.  Increase knowledge and/or ability of: coping skills and healthy habits  3.  Demonstrate ability to: Increase healthy adjustment to current life circumstances, Increase adequate support systems for patient/family and Decrease self-medicating  behaviors  INTERVENTIONS: Interventions utilized:  Solution-Focused Strategies, Supportive Counseling, Psychoeducation and/or Health Education and Link to [employer] Standardized Assessments completed: Not Needed  ASSESSMENT: Patient currently experiencing depression and anxiety triggered by difficulty adjusting to not being as independent while healing from a broken hip. Pt reports hx of polysubstance use. He reports maintaining sobriety since discharge from hospital, approximately four weeks.  Pt receives strong support from sisters and fiance'.  Patient may benefit from outpatient substance use treatment and psychotherapy. LCSWA discussed correlation between one's physical and mental health, in addition, to how substance use negatively impacts both. Pt had good insight and was successful in identifying healthy coping skills. He has observed positive changes in behavior and strengthening/flexiability in leg.   LCSWA provided information on substance use services being held virtually at this time. Pt is connected with [employer] and plans on participating in their meetings when they return to face to face.   PLAN: 1. Follow up with behavioral health clinician on : Pt was encouraged to contact LCSWA if symptoms worsen or fail to improve to schedule behavioral appointments at [facility]. 2. Behavioral recommendations: LCSWA recommends that pt apply healthy coping skills discussed and utilize provided resources. Pt is encouraged to schedule follow up appointment with LCSWA 3. Referral(s): [facility] [employer] (In Clinic)  Bridgett Larsson, [place] 01/23/2019 10:10 AM

## 2019-01-20 ENCOUNTER — Ambulatory Visit: Payer: Self-pay | Admitting: Primary Care

## 2019-01-25 ENCOUNTER — Ambulatory Visit: Payer: Self-pay | Admitting: Family Medicine

## 2019-02-11 ENCOUNTER — Other Ambulatory Visit: Payer: Self-pay

## 2019-02-11 ENCOUNTER — Emergency Department
Admission: EM | Admit: 2019-02-11 | Discharge: 2019-02-11 | Disposition: A | Discharge status: Other Acute Inpatient Hospital | Payer: Medicaid Other | Attending: Emergency Medicine | Admitting: Emergency Medicine

## 2019-02-11 ENCOUNTER — Inpatient Hospital Stay
Admission: AD | Admit: 2019-02-11 | Discharge: 2019-02-13 | DRG: 885 | Disposition: A | Discharge status: Home / Self Care | Payer: Medicare Other | Attending: Psychiatry | Admitting: Psychiatry

## 2019-02-11 DIAGNOSIS — Z9119 Patient's noncompliance with other medical treatment and regimen: Secondary | ICD-10-CM | POA: Diagnosis not present

## 2019-02-11 DIAGNOSIS — I10 Essential (primary) hypertension: Secondary | ICD-10-CM | POA: Diagnosis not present

## 2019-02-11 DIAGNOSIS — F209 Schizophrenia, unspecified: Secondary | ICD-10-CM | POA: Diagnosis not present

## 2019-02-11 DIAGNOSIS — R4689 Other symptoms and signs involving appearance and behavior: Secondary | ICD-10-CM | POA: Diagnosis present

## 2019-02-11 DIAGNOSIS — R45851 Suicidal ideations: Secondary | ICD-10-CM | POA: Diagnosis not present

## 2019-02-11 DIAGNOSIS — Z7901 Long term (current) use of anticoagulants: Secondary | ICD-10-CM | POA: Diagnosis not present

## 2019-02-11 DIAGNOSIS — Z046 Encounter for general psychiatric examination, requested by authority: Secondary | ICD-10-CM | POA: Diagnosis not present

## 2019-02-11 DIAGNOSIS — F332 Major depressive disorder, recurrent severe without psychotic features: Secondary | ICD-10-CM | POA: Diagnosis not present

## 2019-02-11 DIAGNOSIS — Z79899 Other long term (current) drug therapy: Secondary | ICD-10-CM | POA: Insufficient documentation

## 2019-02-11 DIAGNOSIS — Z1159 Encounter for screening for other viral diseases: Secondary | ICD-10-CM | POA: Diagnosis not present

## 2019-02-11 DIAGNOSIS — F1020 Alcohol dependence, uncomplicated: Secondary | ICD-10-CM | POA: Diagnosis present

## 2019-02-11 DIAGNOSIS — Y907 Blood alcohol level of 200-239 mg/100 ml: Secondary | ICD-10-CM | POA: Diagnosis present

## 2019-02-11 DIAGNOSIS — F17210 Nicotine dependence, cigarettes, uncomplicated: Secondary | ICD-10-CM | POA: Diagnosis not present

## 2019-02-11 DIAGNOSIS — F1424 Cocaine dependence with cocaine-induced mood disorder: Secondary | ICD-10-CM | POA: Diagnosis present

## 2019-02-11 DIAGNOSIS — F1420 Cocaine dependence, uncomplicated: Secondary | ICD-10-CM | POA: Diagnosis present

## 2019-02-11 DIAGNOSIS — F1994 Other psychoactive substance use, unspecified with psychoactive substance-induced mood disorder: Secondary | ICD-10-CM | POA: Diagnosis present

## 2019-02-11 LAB — COMPREHENSIVE METABOLIC PANEL
ALT: 18 U/L (ref 0–44)
AST: 20 U/L (ref 15–41)
Albumin: 4.3 g/dL (ref 3.5–5.0)
Alkaline Phosphatase: 83 U/L (ref 38–126)
Anion gap: 11 (ref 5–15)
BUN: 6 mg/dL (ref 6–20)
CO2: 24 mmol/L (ref 22–32)
Calcium: 9.5 mg/dL (ref 8.9–10.3)
Chloride: 109 mmol/L (ref 98–111)
Creatinine, Ser: 0.9 mg/dL (ref 0.61–1.24)
GFR calc Af Amer: >60 mL/min (ref >60)
GFR calc non Af Amer: >60 mL/min (ref >60)
Glucose, Bld: 109 mg/dL — ABNORMAL HIGH (ref 70–99)
Potassium: 4 mmol/L (ref 3.5–5.1)
Sodium: 144 mmol/L (ref 135–145)
Total Bilirubin: 0.3 mg/dL (ref 0.3–1.2)
Total Protein: 8.2 g/dL — ABNORMAL HIGH (ref 6.5–8.1)

## 2019-02-11 LAB — URINE DRUG SCREEN, QUALITATIVE (ARMC ONLY)
Amphetamines, Ur Screen: NOT DETECTED
Barbiturates, Ur Screen: NOT DETECTED
Benzodiazepine, Ur Scrn: NOT DETECTED
Cannabinoid 50 Ng, Ur ~~LOC~~: NOT DETECTED
Cocaine Metabolite,Ur ~~LOC~~: POSITIVE — AB
MDMA (Ecstasy)Ur Screen: NOT DETECTED
Methadone Scn, Ur: NOT DETECTED
Opiate, Ur Screen: NOT DETECTED
Phencyclidine (PCP) Ur S: NOT DETECTED
Tricyclic, Ur Screen: NOT DETECTED

## 2019-02-11 LAB — CBC
HCT: 43.4 % (ref 39.0–52.0)
Hemoglobin: 14 g/dL (ref 13.0–17.0)
MCH: 27 pg (ref 26.0–34.0)
MCHC: 32.3 g/dL (ref 30.0–36.0)
MCV: 83.8 fL (ref 80.0–100.0)
Platelets: 293 10*3/uL (ref 150–400)
RBC: 5.18 MIL/uL (ref 4.22–5.81)
RDW: 13.4 % (ref 11.5–15.5)
WBC: 10.5 10*3/uL (ref 4.0–10.5)
nRBC: 0 % (ref 0.0–0.2)

## 2019-02-11 LAB — ACETAMINOPHEN LEVEL: Acetaminophen (Tylenol), Serum: <10 ug/mL — ABNORMAL LOW (ref 10–30)

## 2019-02-11 LAB — SALICYLATE LEVEL: Salicylate Lvl: <7 mg/dL (ref 2.8–30.0)

## 2019-02-11 LAB — ETHANOL: Alcohol, Ethyl (B): 208 mg/dL — ABNORMAL HIGH (ref <10)

## 2019-02-11 LAB — SARS CORONAVIRUS 2 BY RT PCR (HOSPITAL ORDER, PERFORMED IN ~~LOC~~ HOSPITAL LAB): SARS Coronavirus 2: NEGATIVE

## 2019-02-11 MED ORDER — ONDANSETRON 4 MG PO TBDP: 4.0000 mg | ORAL_TABLET | Freq: Four times a day (QID) | ORAL | Status: DC | PRN

## 2019-02-11 MED ORDER — ACETAMINOPHEN 325 MG PO TABS: 650.0000 mg | ORAL_TABLET | Freq: Four times a day (QID) | ORAL | Status: DC | PRN

## 2019-02-11 MED ORDER — DOCUSATE SODIUM 100 MG PO CAPS
100.0000 mg | ORAL_CAPSULE | Freq: Two times a day (BID) | ORAL | Status: DC
  Administered 2019-02-12 – 2019-02-13 (×2): 100 mg via ORAL
  Filled 2019-02-11 (×3): qty 1

## 2019-02-11 MED ORDER — AMLODIPINE BESYLATE 5 MG PO TABS
5.0000 mg | ORAL_TABLET | Freq: Every day | ORAL | Status: DC
  Administered 2019-02-11: 09:00:00 5 mg via ORAL
  Filled 2019-02-11: qty 1

## 2019-02-11 MED ORDER — LORAZEPAM 2 MG/ML IJ SOLN
0.0000 mg | Freq: Four times a day (QID) | INTRAMUSCULAR | Status: DC
  Administered 2019-02-11: 0 mg via INTRAVENOUS

## 2019-02-11 MED ORDER — ADULT MULTIVITAMIN W/MINERALS CH
1.0000 | ORAL_TABLET | Freq: Every day | ORAL | Status: DC
  Administered 2019-02-12 – 2019-02-13 (×2): 1 via ORAL
  Filled 2019-02-11 (×2): qty 1

## 2019-02-11 MED ORDER — ACETAMINOPHEN 500 MG PO TABS: 500.0000 mg | ORAL_TABLET | Freq: Three times a day (TID) | ORAL | Status: DC

## 2019-02-11 MED ORDER — ALUM & MAG HYDROXIDE-SIMETH 200-200-20 MG/5ML PO SUSP: 30.0000 mL | ORAL | Status: DC | PRN

## 2019-02-11 MED ORDER — VITAMIN B-1 100 MG PO TABS
100.0000 mg | ORAL_TABLET | Freq: Every day | ORAL | Status: DC
  Administered 2019-02-12 – 2019-02-13 (×2): 100 mg via ORAL
  Filled 2019-02-11 (×2): qty 1

## 2019-02-11 MED ORDER — AMLODIPINE BESYLATE 5 MG PO TABS
5.0000 mg | ORAL_TABLET | Freq: Every day | ORAL | Status: DC
  Administered 2019-02-12 – 2019-02-13 (×2): 5 mg via ORAL
  Filled 2019-02-11 (×2): qty 1

## 2019-02-11 MED ORDER — LORAZEPAM 1 MG PO TABS: 1.0000 mg | ORAL_TABLET | Freq: Two times a day (BID) | ORAL | Status: DC

## 2019-02-11 MED ORDER — MAGNESIUM HYDROXIDE 400 MG/5ML PO SUSP: 30.0000 mL | Freq: Every day | ORAL | Status: DC | PRN

## 2019-02-11 MED ORDER — LORAZEPAM 2 MG PO TABS: 0.0000 mg | ORAL_TABLET | Freq: Two times a day (BID) | ORAL | Status: DC

## 2019-02-11 MED ORDER — LORAZEPAM 1 MG PO TABS: 1.0000 mg | ORAL_TABLET | Freq: Four times a day (QID) | ORAL | Status: DC | PRN

## 2019-02-11 MED ORDER — LOPERAMIDE HCL 2 MG PO CAPS: 2.0000 mg | ORAL_CAPSULE | ORAL | Status: DC | PRN

## 2019-02-11 MED ORDER — CYCLOBENZAPRINE HCL 10 MG PO TABS: 10.0000 mg | ORAL_TABLET | Freq: Three times a day (TID) | ORAL | Status: DC | PRN

## 2019-02-11 MED ORDER — THIAMINE HCL 100 MG/ML IJ SOLN: 100.0000 mg | Freq: Every day | INTRAMUSCULAR | Status: DC

## 2019-02-11 MED ORDER — HYDROXYZINE HCL 25 MG PO TABS: 25.0000 mg | ORAL_TABLET | Freq: Three times a day (TID) | ORAL | Status: DC | PRN

## 2019-02-11 MED ORDER — VITAMIN B-1 100 MG PO TABS
100.0000 mg | ORAL_TABLET | Freq: Every day | ORAL | Status: DC
  Administered 2019-02-11: 100 mg via ORAL
  Filled 2019-02-11: qty 1

## 2019-02-11 MED ORDER — LORAZEPAM 1 MG PO TABS: 1.0000 mg | ORAL_TABLET | Freq: Every day | ORAL | Status: DC

## 2019-02-11 MED ORDER — LORAZEPAM 1 MG PO TABS
1.0000 mg | ORAL_TABLET | Freq: Four times a day (QID) | ORAL | Status: AC
  Administered 2019-02-12: 1 mg via ORAL
  Filled 2019-02-11 (×2): qty 1

## 2019-02-11 MED ORDER — LORAZEPAM 2 MG/ML IJ SOLN: 0.0000 mg | Freq: Two times a day (BID) | INTRAMUSCULAR | Status: DC

## 2019-02-11 MED ORDER — LORAZEPAM 2 MG PO TABS: 0.0000 mg | ORAL_TABLET | Freq: Four times a day (QID) | ORAL | Status: DC

## 2019-02-11 MED ORDER — AMLODIPINE BESYLATE 5 MG PO TABS: 5.0000 mg | ORAL_TABLET | Freq: Every day | ORAL | Status: DC

## 2019-02-11 MED ORDER — VITAMIN D 25 MCG (1000 UNIT) PO TABS
1000.0000 [IU] | ORAL_TABLET | Freq: Every day | ORAL | Status: DC
  Administered 2019-02-12 – 2019-02-13 (×2): 1000 [IU] via ORAL
  Filled 2019-02-11 (×2): qty 1

## 2019-02-11 MED ORDER — LORAZEPAM 1 MG PO TABS
1.0000 mg | ORAL_TABLET | Freq: Three times a day (TID) | ORAL | Status: DC
  Administered 2019-02-13: 1 mg via ORAL
  Filled 2019-02-11: qty 1

## 2019-02-11 MED ORDER — TRAZODONE HCL 50 MG PO TABS
50.0000 mg | ORAL_TABLET | Freq: Every evening | ORAL | Status: DC | PRN
  Administered 2019-02-12: 50 mg via ORAL
  Filled 2019-02-11: qty 1

## 2019-02-11 NOTE — ED Notes (Signed)

## 2019-02-11 NOTE — ED Notes (Signed)
Received a call from [facility] - pt to be admitted after change of shift  Room 322

## 2019-02-11 NOTE — ED Provider Notes (Addendum)
[facility] Emergency Department Provider Note  ____________________________________________   I have reviewed the triage vital signs and the nursing notes. Where available I have reviewed prior notes and, if possible and indicated, outside hospital notes.    HISTORY  Chief Complaint Psychiatric Evaluation    HPI Sean Hoffman is a 53 y.o. male  History of bipolar disorder hepatitis C hypertension, schizophrenia, states that he was having a grief reaction today he did threaten to kill himself he admits.  According to police notes, patient is under IVC, he fled the scene with a knife in his hand, and he said that if a person he would kill himself.  Patient's daughter recently passed away.  Patient states that he no longer wants to kill himself but he does admit to these actions.  Did not take an overdose. Has history of EtOH abuse, states he had a little alcohol today also states that he cocaine.  No history of withdrawal he states.   Past Medical History:  Diagnosis Date  . Bipolar affective (HCC)   . History of hepatitis C   . Hypertension   . Noncompliance with medication regimen 12/28/2018  . Schizophrenia (HCC)   . Vitamin D deficiency     Patient Active Problem List   Diagnosis Date Noted  . Vitamin D deficiency 01/09/2019  . Noncompliance with medication regimen 12/28/2018  . Hepatitis C antibody positive in blood 12/27/2018  . Closed right hip fracture (HCC) 12/26/2018  . Fracture of femoral neck, right (HCC) 12/26/2018  . Substance induced mood disorder (HCC) 10/09/2016  . HTN (hypertension) 08/06/2016  . Alcohol use disorder, severe, dependence (HCC) 08/05/2016  . Cocaine use disorder, moderate, dependence (HCC) 08/05/2016  . Tobacco use disorder 08/05/2016  . Severe recurrent major depression without psychotic features (HCC) 08/04/2016    Past Surgical History:  Procedure Laterality Date  . BELOW KNEE LEG AMPUTATION Right   .  INTRAMEDULLARY (IM) NAIL INTERTROCHANTERIC Right 12/26/2018   Procedure: ORIF Right femoral neck fracture.;  Surgeon: Myrene GalasHandy, Michael, MD;  [facility];  Service: Orthopedics;  Laterality: Right;    Prior to Admission medications   Medication Sig Start Date End Date Taking? Authorizing Provider  acetaminophen (TYLENOL) 500 MG tablet Take 1-2 tablets (500-1,000 mg total) by mouth every 8 (eight) hours. 12/28/18   Montez MoritaPaul, Johntavious, PA-C  amLODipine (NORVASC) 5 MG tablet Take 1 tablet (5 mg total) by mouth daily. 12/29/18   Montez MoritaPaul, Ngoc, PA-C  Cholecalciferol (VITAMIN D) 125 MCG (5000 UT) CAPS Take 1 capsule by mouth daily. 12/28/18   Montez MoritaPaul, Shakur, PA-C  cyclobenzaprine (FLEXERIL) 10 MG tablet Take 1 tablet (10 mg total) by mouth 3 (three) times daily as needed for muscle spasms. 01/09/19   Storm FriskWright, Patrick E, MD  docusate sodium (COLACE) 100 MG capsule Take 1 capsule (100 mg total) by mouth 2 (two) times daily. 01/09/19   Storm FriskWright, Patrick E, MD  enoxaparin (LOVENOX) 40 MG/0.4ML injection Inject 0.4 mLs (40 mg total) into the skin daily. 12/29/18   Montez MoritaPaul, Jasean, PA-C  HYDROcodone-acetaminophen (NORCO) 7.5-325 MG tablet Take 1-2 tablets by mouth every 6 (six) hours as needed for severe pain. 12/28/18   Montez MoritaPaul, [place], PA-C  Multiple Vitamin (MULTIVITAMIN WITH MINERALS) TABS tablet Take 1 tablet by mouth daily. 12/29/18   Montez MoritaPaul, Javarie, PA-C  vitamin C (VITAMIN C) 1000 MG tablet Take 1 tablet (1,000 mg total) by mouth daily. 12/29/18   Montez MoritaPaul, Bowie, PA-C    Allergies Patient has no known allergies.  No family history on file.  Social History Social History   Tobacco Use  . Smoking status: Current Every Day Smoker    Packs/day: 2.00    Types: Cigarettes, E-cigarettes  . Smokeless tobacco: Never Used  Substance Use Topics  . Alcohol use: Yes    Comment: "If I drink I'm just gonna drink till I pass out. I binge drink."  . Drug use: Yes    Types: Cocaine, Marijuana    Review of Systems Constitutional: No  fever/chills Eyes: No visual changes. ENT: No sore throat. No stiff neck no neck pain Cardiovascular: Denies chest pain. Respiratory: Denies shortness of breath. Gastrointestinal:   no vomiting.  No diarrhea.  No constipation. Genitourinary: Negative for dysuria. Musculoskeletal: Negative lower extremity swelling Skin: Negative for rash. Neurological: Negative for severe headaches, focal weakness or numbness.   ____________________________________________   PHYSICAL EXAM:  VITAL SIGNS: ED Triage Vitals  Enc Vitals Group     BP 02/11/19 0057 (!) 150/120     Pulse Rate 02/11/19 0057 86     Resp 02/11/19 0057 20     Temp 02/11/19 0057 97.6 F (36.4 C)     Temp Source 02/11/19 0057 Oral     SpO2 02/11/19 0057 98 %     Weight 02/11/19 0058 178 lb (80.7 kg)     Height 02/11/19 0058 6' (1.829 m)     Head Circumference --      Peak Flow --      Pain Score 02/11/19 0057 6     Pain Loc --      Pain Edu? --      Excl. in GC? --     Constitutional: Alert and oriented. Well appearing and in no acute distress. Eyes: Conjunctivae are normal Head: Atraumatic HEENT: No congestion/rhinnorhea. Mucous membranes are moist.  Oropharynx non-erythematous Neck:   Nontender with no meningismus, no masses, no stridor Cardiovascular: Normal rate, regular rhythm. Grossly normal heart sounds.  Good peripheral circulation. Respiratory: Normal respiratory effort.  No retractions. Lungs CTAB. Abdominal: Soft and nontender. No distention. No guarding no rebound Back:  There is no focal tenderness or step off.  there is no midline tenderness there are no lesions noted. there is no CVA tenderness Musculoskeletal: No lower extremity tenderness, no upper extremity tenderness. No joint effusions, no DVT signs strong distal pulses no edema, right BKA noted.  Neurologic:  Normal speech and language. No gross focal neurologic deficits are appreciated.  Skin:  Skin is warm, dry and intact. No rash  noted. Psychiatric: Mood and affect are emotionally upset but not dangerously so.  ____________________________________________   LABS (all labs ordered are listed, but only abnormal results are displayed)  Labs Reviewed  COMPREHENSIVE METABOLIC PANEL - Abnormal; Notable for the following components:      Result Value   Glucose, Bld 109 (*)    Total Protein 8.2 (*)    All other components within normal limits  ETHANOL - Abnormal; Notable for the following components:   Alcohol, Ethyl (B) 208 (*)    All other components within normal limits  ACETAMINOPHEN LEVEL - Abnormal; Notable for the following components:   Acetaminophen (Tylenol), Serum <10 (*)    All other components within normal limits  URINE DRUG SCREEN, QUALITATIVE ([facility] ONLY) - Abnormal; Notable for the following components:   Cocaine Metabolite,[place] POSITIVE (*)    All other components within normal limits  SALICYLATE LEVEL  CBC    Pertinent labs  results that were  available during my care of the patient were reviewed by me and considered in my medical decision making (see chart for details). ____________________________________________  EKG  I personally interpreted any EKGs ordered by me or triage  ____________________________________________  RADIOLOGY  Pertinent labs & imaging results that were available during my care of the patient were reviewed by me and considered in my medical decision making (see chart for details). If possible, patient and/or family made aware of any abnormal findings.  No results found. ____________________________________________    PROCEDURES  Procedure(s) performed: None  Procedures  Critical Care performed: None  ____________________________________________   INITIAL IMPRESSION / ASSESSMENT AND PLAN / ED COURSE  Pertinent labs & imaging results that were available during my care of the patient were reviewed by me and considered in my medical decision making (see  chart for details).  Patient here after threatening himself with a knife and a grief reaction because of losses in his family.  He is under IVC, will have him evaluated by psychiatry.  At this time he expresses remorse for his actions and states he was having a bad night.  ----------------------------------------- 7:00 AM on 02/11/2019 -----------------------------------------  Signed out at the end of my shift to Dr. Don Perking    ____________________________________________   FINAL CLINICAL IMPRESSION(S) / ED DIAGNOSES  Final diagnoses:  None      This chart was dictated using voice recognition software.  Despite best efforts to proofread,  errors can occur which can change meaning.      Jeanmarie Plant, MD 02/11/19 1610    Jeanmarie Plant, MD 02/11/19 [phone]

## 2019-02-11 NOTE — ED Notes (Signed)
TTS computer at pt bedside.

## 2019-02-11 NOTE — BH Assessment (Signed)
Assessment Note  Sean Hoffman is an 53 y.o. male. Who has presented follow threatening to harm himself. Pt reports that he is unable to recall what took place today although he denied threatening to kill himself and states that he did not have a knife. IVC documentation reports that he fled the scene with a knife in his hand, and he said that if a person he would kill himself. Pt reports being chronically non compliant with his mental health treatment. Pt has a history of drug and alcohol use. He shares that he continues to actively use cocaine. Although he state that today was the first day that he has drank alcohol in quite some time. He continues to explain that he recently lost a very close family member. He reports an inability to obtain restful sleep. Pt presents as flat and blunted. Pt. denies any active suicidal ideation. Pt. denies the presence of any auditory or visual hallucinations at this time. Patient denies any other medical complaints.   Diagnosis: Schizophrenia   Past Medical History:  Past Medical History:  Diagnosis Date  . Bipolar affective (HCC)   . History of hepatitis C   . Hypertension   . Noncompliance with medication regimen 12/28/2018  . Schizophrenia (HCC)   . Vitamin D deficiency     Past Surgical History:  Procedure Laterality Date  . BELOW KNEE LEG AMPUTATION Right   . INTRAMEDULLARY (IM) NAIL INTERTROCHANTERIC Right 12/26/2018   Procedure: ORIF Right femoral neck fracture.;  Surgeon: Myrene Galas, MD;  Location: [facility] OR;  Service: Orthopedics;  Laterality: Right;    Family History: No family history on file.  Social History:  reports that he has been smoking cigarettes and e-cigarettes. He has been smoking about 2.00 packs per day. He has never used smokeless tobacco. He reports current alcohol use. He reports current drug use. Drugs: Cocaine and Marijuana.  Additional Social History:  Alcohol / Drug Use Pain Medications: See PTA Prescriptions: See PTA Over  the Counter: See PTA History of alcohol / drug use?: Yes Longest period of sobriety (when/how long): 9 Years Negative Consequences of Use: Personal relationships, Work / Programmer, multimedia, Surveyor, quantity Withdrawal Symptoms: Blackouts Substance #1 Name of Substance 1: Cocaine  1 - Age of First Use: UTA 1 - Amount (size/oz): UTA 1 - Frequency: UTA  1 - Duration: Ongoing  1 - Last Use / Amount: PTA  CIWA: CIWA-Ar BP: 136/78 Pulse Rate: 84 Nausea and Vomiting: no nausea and no vomiting Tactile Disturbances: none Tremor: no tremor Auditory Disturbances: not present Paroxysmal Sweats: no sweat visible Visual Disturbances: not present Anxiety: no anxiety, at ease Headache, Fullness in Head: none present Agitation: normal activity Orientation and Clouding of Sensorium: oriented and can do serial additions CIWA-Ar Total: 0 COWS:    Allergies: No Known Allergies  Home Medications: (Not in a hospital admission)   OB/GYN Status:  No LMP for male patient.  General Assessment Data Location of Assessment: [facility] ED TTS Assessment: In system Is this a Tele or Face-to-Face Assessment?: Tele Assessment Is this an Initial Assessment or a Re-assessment for this encounter?: Initial Assessment Patient Accompanied by:: N/A Language Other than English: No Living Arrangements: Other (Comment) What gender do you identify as?: Male Living Arrangements: Alone Can pt return to current living arrangement?: Yes Admission Status: Involuntary Petitioner: Other Is patient capable of signing voluntary admission?: No Referral Source: Other Insurance type: Medicare/ Medicaid   Medical Screening Exam [facility] Walk-in ONLY) Medical Exam completed: Yes  Crisis Care Plan  Living Arrangements: Alone Name of Psychiatrist: none  Name of Therapist: none   Education Status Is patient currently in school?: No Is the patient employed, unemployed or receiving disability?: Receiving disability income  Risk to self with the  past 6 months Suicidal Ideation: No-Not Currently/Within Last 6 Months Has patient been a risk to self within the past 6 months prior to admission? : Yes Suicidal Intent: No-Not Currently/Within Last 6 Months Has patient had any suicidal intent within the past 6 months prior to admission? : Yes Is patient at risk for suicide?: Yes Suicidal Plan?: No-Not Currently/Within Last 6 Months(per BPD report) Has patient had any suicidal plan within the past 6 months prior to admission? : Other (comment)(pt denied ) Access to Means: Yes Specify Access to Suicidal Means: reportedly had a knife What has been your use of drugs/alcohol within the last 12 months?: Alcohol and cocaine  Previous Attempts/Gestures: Yes How many times?: (Several ) Other Self Harm Risks: none reported  Triggers for Past Attempts: Unknown Intentional Self Injurious Behavior: None Family Suicide History: No Recent stressful life event(s): Conflict (Comment), Loss (Comment) Persecutory voices/beliefs?: No Depression: Yes Depression Symptoms: Insomnia, Feeling worthless/self pity Substance abuse history and/or treatment for substance abuse?: Yes Suicide prevention information given to non-admitted patients: Not applicable  Risk to Others within the past 6 months Homicidal Ideation: No Does patient have any lifetime risk of violence toward others beyond the six months prior to admission? : No Thoughts of Harm to Others: No Current Homicidal Intent: No Current Homicidal Plan: No Access to Homicidal Means: No Identified Victim: none History of harm to others?: No Assessment of Violence: None Noted Violent Behavior Description: none noted  Does patient have access to weapons?: No Criminal Charges Pending?: No Does patient have a court date: No Is patient on probation?: No  Psychosis Hallucinations: None noted Delusions: None noted  Mental Status Report Appearance/Hygiene: In scrubs Eye Contact: Fair Motor  Activity: Freedom of movement Speech: Slow Level of Consciousness: Drowsy Mood: Depressed Affect: Flat, Constricted Anxiety Level: Minimal Thought Processes: Coherent Judgement: Partial Orientation: Situation, Time, Place, Person Obsessive Compulsive Thoughts/Behaviors: None  Cognitive Functioning Concentration: Fair Memory: Remote Intact, Recent Intact Is patient IDD: No Insight: Poor Impulse Control: Fair Appetite: Fair Have you had any weight changes? : No Change Sleep: Decreased Total Hours of Sleep: 3 Vegetative Symptoms: None  ADLScreening [facility] Assessment Services) Patient's cognitive ability adequate to safely complete daily activities?: Yes Patient able to express need for assistance with ADLs?: Yes Independently performs ADLs?: Yes (appropriate for developmental age)  Prior Inpatient Therapy Prior Inpatient Therapy: Yes Prior Therapy Dates: 2017 Prior [facility] Reason for Treatment: Schizophrenia   Prior Outpatient Therapy Prior Outpatient Therapy: No Does patient have an ACCT team?: No Does patient have Intensive In-House Services?  : No Does patient have [facility] services? : No Does patient have P4CC services?: No  ADL Screening (condition at time of admission) Patient's cognitive ability adequate to safely complete daily activities?: Yes Patient able to express need for assistance with ADLs?: Yes Independently performs ADLs?: Yes (appropriate for developmental age)       Abuse/Neglect Assessment (Assessment to be complete while patient is alone) Abuse/Neglect Assessment Can Be Completed: Yes Physical Abuse: Denies Verbal Abuse: Denies Sexual Abuse: Denies Exploitation of patient/patient's resources: Denies Self-Neglect: Denies Values / Beliefs Cultural Requests During Hospitalization: None, Other (comment) Consults Spiritual Care Consult Needed: No Social Work Consult Needed: No Merchant navy officerAdvance Directives (For Healthcare) Does  Patient Have a  Medical Advance Directive?: Yes Type of Advance Directive: Living will          Disposition:  Disposition Initial Assessment Completed for this Encounter: Yes Patient referred to: Other (Comment)(Consult with [facility] )  On Site Evaluation by:   Reviewed with Physician:    Asa Saunas 02/11/2019 6:36 AM

## 2019-02-11 NOTE — ED Notes (Signed)
BEHAVIORAL HEALTH ROUNDING Patient sleeping: No. Patient alert and oriented: yes Behavior appropriate: Yes.  ; If no, describe:  Nutrition and fluids offered: yes Toileting and hygiene offered: Yes  Sitter present: q15 minute observations and security  monitoring Law enforcement present: Yes  ODS  

## 2019-02-11 NOTE — ED Triage Notes (Signed)
Patient here with [place] [employer] under IVC.  Patient states his girlfriend told the police he said he wanted to kill himself.  Patient reports recently found out that his daughter had died.  Patient admits to alcohol intake unsure of amount and cocaine use.

## 2019-02-11 NOTE — ED Notes (Signed)
Pt transferred to 20 hallway bed  I introduced myself to him  He denies pain  Visiting hours and phone times explained to patient. Patient verbalizes understanding, and verbal contract for safety obtained.

## 2019-02-11 NOTE — ED Notes (Signed)

## 2019-02-11 NOTE — ED Notes (Signed)
ED  Is the patient under IVC or is there intent for IVC: Yes.   Is the patient medically cleared: Yes.   Is there vacancy in the ED BHU:  Unit closed   Is the population mix appropriate for patient: Yes.   Is the patient awaiting placement in inpatient or outpatient setting: Yes.   Has the patient had a psychiatric consult: Yes.  SOC referral for inpatient placement   Survey of unit performed for contraband, proper placement and condition of furniture, tampering with fixtures in bathroom, shower, and each patient room: Yes.  ; Findings:  APPEARANCE/BEHAVIOR Calm and cooperative NEURO ASSESSMENT Orientation: oriented x3  Denies pain Hallucinations: No.None noted (Hallucinations)  denies Speech: Normal Gait:  Prosthesis  - pt uses a walker without his prosthesis in place  RESPIRATORY ASSESSMENT Even  Unlabored respirations  CARDIOVASCULAR ASSESSMENT Pulses equal   regular rate  Skin warm and dry   GASTROINTESTINAL ASSESSMENT no GI complaint EXTREMITIES Full ROM  PLAN OF CARE Provide calm/safe environment. Vital signs assessed twice daily. ED BHU Assessment once each 12-hour shift. Collaborate with TTS when available or as condition indicates. Assure the ED provider has rounded once each shift. Provide and encourage hygiene. Provide redirection as needed. Assess for escalating behavior; address immediately and inform ED provider.  Assess family dynamic and appropriateness for visitation as needed: Yes.  ; If necessary, describe findings:  Educate the patient/family about BHU procedures/visitation: Yes.  ; If necessary, describe findings:

## 2019-02-11 NOTE — ED Notes (Signed)
BEHAVIORAL HEALTH ROUNDING Patient sleeping: Yes.   Patient alert and oriented: yes Behavior appropriate: Yes.  ; If no, describe:  Nutrition and fluids offered: No Toileting and hygiene offered: No Sitter present: not applicable Law enforcement present: Yes  

## 2019-02-11 NOTE — ED Notes (Signed)
Gave patient [place] tray and drink per charge nurse.AS

## 2019-02-11 NOTE — BH Assessment (Signed)
Patient is to be admitted to [facility] by Dr. Lenon Ahmadi.  Attending Physician will be Dr. Toni Amend.   Patient has been assigned to room 322, by [facility] Charge Nurse Lorenda Hatchet.   Intake Paper Work has been signed and placed on patient chart.   ER staff is aware of the admission:  Ronnie, ER Secretary    Dr. Don Perking, ER MD   Amy T., Patient's Nurse   Dondra Prader, Patient Access.

## 2019-02-12 DIAGNOSIS — R45851 Suicidal ideations: Secondary | ICD-10-CM

## 2019-02-12 NOTE — [facility] Counselor (Signed)
Adult Comprehensive Assessment  Patient ID: Sean Hoffman, male   DOB: 12/26/1965, 53 y.o.   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" Financial / Lack of resources (include bankruptcy): disability Housing / Lack of housing: stable Physical health (include injuries & life threatening diseases): Pt has an amputated right leg Social relationships: "not many" Substance abuse: ETOH Bereavement / Loss: mom, maternal grandmother, uncle and daughter last Friday  Living/Environment/Situation:  Living Arrangements: Spouse/significant other Who else lives in the home?: fiancee How long has patient lived in current situation?: 7 years What is atmosphere in current home: Comfortable  Family History:  Marital status: Divorced Divorced, when?: 2000 Additional relationship information: Pt is in a current long term relationship and states he has fiance'. Are you sexually active?: Yes What is your sexual orientation?: heterosexual Does patient have children?: Yes How many children?: 2 How is patient's relationship with their children?: pt reports his daughter passed this past Friday and he has a good relationship with his 20yo son  Childhood History:  By whom was/is the patient raised?: Mother Additional childhood history information: Pt stated his father was not involved growing up.  Description of patient's relationship with caregiver when they were a child: Pt states his mother was supportive to him and was always there for him.  How were you disciplined when you got in trouble as a  child/adolescent?: n/a Does patient have siblings?: Yes Number of Siblings: 2 Description of patient's current relationship with siblings: Pt has 2 sisters. States they have a wonderful relationship.  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?: No Are There Guns or Other Weapons in Your Home?: No  Financial Resources:   Surveyor, quantityinancial resources: Insurance claims handlereceives SSDI Does patient have a Lawyerrepresentative payee or guardian?: Yes Name of representative payee or guardian: payee- [place]  Alcohol/Substance Abuse:   What has been your use of drugs/alcohol within the last 12 months?: alcohol- one time If attempted suicide, did drugs/alcohol play a role in this?: Yes Alcohol/Substance Abuse Treatment Hx: Past Tx, Inpatient, Past Tx, Outpatient, Attends AA/NA Has alcohol/substance abuse ever caused legal problems?: No  Social Support System:   Patient's Community Support System: Good Describe Community Support System: family Type of faith/religion: Baptist How does patient's faith help to cope with current illness?: "it's helping to deal  with it differently than before when I used to drink.  Leisure/Recreation:   Leisure and Hobbies: Spending time with grandchildren, watching tv, and cooking  Strengths/Needs:   What is the patient's perception of their strengths?: communicate, cooking, motivated by his faith Patient states these barriers may affect/interfere with their treatment: none reported Patient states these barriers may affect their return to the community: none reported  Discharge Plan:   Currently receiving community mental health services: Yes (From Whom)([facility] Advertising copywriter - Peer support program) Patient states concerns and preferences for aftercare planning are: Pt reports he will continue receiving services from [employer] [facility] [employer] [facility]. Patient states they will know when they are safe and ready for discharge when: "I can feel I am. I'm healthy and focus" Does patient have access to transportation?: Yes(fiancee) Does patient have financial barriers related to discharge medications?: No Will patient be returning to same living situation after discharge?: Yes  Summary/Recommendations:  Patient is a 53 year old male admitted involuntarily and diagnosed with Schizophrenia. Who has presented follow threatening to harm himself. Pt reports that he is unable to recall what took place today although he denied threatening to kill himself and states that he did not have a knife. Patient reported his daughter passed away last 01-09-23. Patient will benefit from crisis stabilization, medication evaluation, group therapy and psychoeducation. In addition to case management for discharge planning. At discharge it is recommended that patient adhere to the established discharge plan and continue treatment.     Sean Hoffman  CUEBAS-COLON. 02/12/2019

## 2019-02-12 NOTE — H&P (Signed)
Psychiatric Admission Assessment Adult  Patient Identification: Sean Hoffman MRN:  [id number] Date of Evaluation:  02/12/2019 Chief Complaint:  Schizophrenia Principal Diagnosis: <principal problem not specified> Diagnosis:  Active Problems:   Suicidal ideation  History of Present Illness: Patient is a 53 yo male admitted for suicidal thoughts. Patient not wanting to engage in conversation this morning. States he is doing better and went back to sleep. Per assessment by Counselor, "Dakin Madani is an 53 y.o. male. Who has presented follow threatening to harm himself. Pt reports that he is unable to recall what took place today although he denied threatening to kill himself and states that he did not have a knife. IVC documentation reports that he fled the scene with a knife in his hand, and he said that if a person he would kill himself. Pt reports being chronically non compliant with his mental health treatment. Pt has a history of drug and alcohol use. He shares that he continues to actively use cocaine. Although he state that today was the first day that he has drank alcohol in quite some time. He continues to explain that he recently lost a very close family member. He reports an inability to obtain restful sleep. Pt presents as flat and blunted. Pt. denies any active suicidal ideation. Pt. denies the presence of any auditory or visual hallucinations at this time. Patient denies any other medical complaints.  Total Time spent with patient: 45 minutes  Past Psychiatric History: unknown  Is the patient at risk to self? No.  Has the patient been a risk to self in the past 6 months? yes Has the patient been a risk to self within the distant past? no Is the patient a risk to others? unknown  Has the patient been a risk to others in the past 6 months? no Has the patient been a risk to others within the distant past? no   Prior Inpatient Therapy:  yes Prior Outpatient Therapy:  no  Alcohol  Screening: 1. How often do you have a drink containing alcohol?: Monthly or less 2. How many drinks containing alcohol do you have on a typical day when you are drinking?: 3 or 4 3. How often do you have six or more drinks on one occasion?: Less than monthly AUDIT-C Score: 3 4. How often during the last year have you found that you were not able to stop drinking once you had started?: Never 5. How often during the last year have you failed to do what was normally expected from you becasue of drinking?: Never 6. How often during the last year have you needed a first drink in the morning to get yourself going after a heavy drinking session?: Never 7. How often during the last year have you had a feeling of guilt of remorse after drinking?: Never 8. How often during the last year have you been unable to remember what happened the night before because you had been drinking?: Never 9. Have you or someone else been injured as a result of your drinking?: No 10. Has a relative or friend or a doctor or another health worker been concerned about your drinking or suggested you cut down?: No Alcohol Use Disorder Identification Test Final Score (AUDIT): 3 Alcohol Brief Interventions/Follow-up: AUDIT Score <7 follow-up not indicated Substance Abuse History in the last 12 months:  yes Consequences of Substance Abuse:mental health issues  Previous Psychotropic Medications: yes Psychological Evaluations: no Past Medical History:  Past Medical History:  Diagnosis Date  .  Bipolar affective (HCC)   . History of hepatitis C   . Hypertension   . Noncompliance with medication regimen 12/28/2018  . Schizophrenia (HCC)   . Vitamin D deficiency     Past Surgical History:  Procedure Laterality Date  . BELOW KNEE LEG AMPUTATION Right   . INTRAMEDULLARY (IM) NAIL INTERTROCHANTERIC Right 12/26/2018   Procedure: ORIF Right femoral neck fracture.;  Surgeon: Myrene Galas, MD;  Location: [facility] OR;  Service: Orthopedics;   Laterality: Right;   Family History: History reviewed. No pertinent family history. Family Psychiatric  History:unknown Tobacco Screening:   Social History:  Social History   Substance and Sexual Activity  Alcohol Use Yes   Comment: "If I drink I'm just gonna drink till I pass out. I binge drink."     Social History   Substance and Sexual Activity  Drug Use Yes  . Types: Cocaine, Marijuana    Additional Social History:                           Allergies:  No Known Allergies Lab Results:  Results for orders placed or performed during the hospital encounter of 02/11/19 (from the past 48 hour(s))  Comprehensive metabolic panel     Status: Abnormal   Collection Time: 02/11/19  1:04 AM  Result Value Ref Range   Sodium 144 135 - 145 mmol/L   Potassium 4.0 3.5 - 5.1 mmol/L   Chloride 109 98 - 111 mmol/L   CO2 24 22 - 32 mmol/L   Glucose, Bld 109 (H) 70 - 99 mg/dL   BUN 6 6 - 20 mg/dL   Creatinine, Ser 2.13 0.61 - 1.24 mg/dL   Calcium 9.5 8.9 - 08.6 mg/dL   Total Protein 8.2 (H) 6.5 - 8.1 g/dL   Albumin 4.3 3.5 - 5.0 g/dL   AST 20 15 - 41 U/L   ALT 18 0 - 44 U/L   Alkaline Phosphatase 83 38 - 126 U/L   Total Bilirubin 0.3 0.3 - 1.2 mg/dL   GFR calc non Af Amer >60 >60 mL/min   GFR calc Af Amer >60 >60 mL/min   Anion gap 11 5 - 15    Comment: Performed at [facility], [address], [place], [place] [address]  Ethanol     Status: Abnormal   Collection Time: 02/11/19  1:04 AM  Result Value Ref Range   Alcohol, Ethyl (B) 208 (H) <10 mg/dL    Comment: (NOTE) Lowest detectable limit for serum alcohol is 10 mg/dL. For medical purposes only. Performed at [facility], [address] [place], [place] [address]   Salicylate level     Status: None   Collection Time: 02/11/19  1:04 AM  Result Value Ref Range   Salicylate Lvl <7.0 2.8 - 30.0 mg/dL    Comment: Performed at [facility], [address] [place], [place] [address]   Acetaminophen level     Status: Abnormal   Collection Time: 02/11/19  1:04 AM  Result Value Ref Range   Acetaminophen (Tylenol), Serum <10 (L) 10 - 30 ug/mL    Comment: (NOTE) Therapeutic concentrations vary significantly. A range of 10-30 ug/mL  may be an effective concentration for many patients. However, some  are best treated at concentrations outside of this range. Acetaminophen concentrations >150 ug/mL at 4 hours after ingestion  and >50 ug/mL at 12 hours after ingestion are often associated with  toxic reactions. Performed at  [facility], [address] [place], [place] [address]   cbc     Status: None   Collection Time: 02/11/19  1:04 AM  Result Value Ref Range   WBC 10.5 4.0 - 10.5 K/uL   RBC 5.18 4.22 - 5.81 MIL/uL   Hemoglobin 14.0 13.0 - 17.0 g/dL   HCT 52.843.4 41.339.0 - 24.452.0 %   MCV 83.8 80.0 - 100.0 fL   MCH 27.0 26.0 - 34.0 pg   MCHC 32.3 30.0 - 36.0 g/dL   RDW 01.013.4 27.211.5 - 53.615.5 %   Platelets 293 150 - 400 K/uL   nRBC 0.0 0.0 - 0.2 %    Comment: Performed at [facility], [address] [place], [place] [address]  Urine Drug Screen, Qualitative     Status: Abnormal   Collection Time: 02/11/19  1:04 AM  Result Value Ref Range   Tricyclic, Ur Screen NONE DETECTED NONE DETECTED   Amphetamines, Ur Screen NONE DETECTED NONE DETECTED   MDMA (Ecstasy)Ur Screen NONE DETECTED NONE DETECTED   Cocaine Metabolite,Ur [place] POSITIVE (A) NONE DETECTED   Opiate, Ur Screen NONE DETECTED NONE DETECTED   Phencyclidine (PCP) Ur S NONE DETECTED NONE DETECTED   Cannabinoid 50 Ng, Ur [place] NONE DETECTED NONE DETECTED   Barbiturates, Ur Screen NONE DETECTED NONE DETECTED   Benzodiazepine, Ur Scrn NONE DETECTED NONE DETECTED   Methadone Scn, Ur NONE DETECTED NONE DETECTED    Comment: (NOTE) Tricyclics + metabolites, urine    Cutoff 1000 ng/mL Amphetamines + metabolites, urine  Cutoff 1000 ng/mL MDMA (Ecstasy), urine              Cutoff 500 ng/mL Cocaine Metabolite,  urine          Cutoff 300 ng/mL Opiate + metabolites, urine        Cutoff 300 ng/mL Phencyclidine (PCP), urine         Cutoff 25 ng/mL Cannabinoid, urine                 Cutoff 50 ng/mL Barbiturates + metabolites, urine  Cutoff 200 ng/mL Benzodiazepine, urine              Cutoff 200 ng/mL Methadone, urine                   Cutoff 300 ng/mL The urine drug screen provides only a preliminary, unconfirmed analytical test result and should not be used for non-medical purposes. Clinical consideration and professional judgment should be applied to any positive drug screen result due to possible interfering substances. A more specific alternate chemical method must be used in order to obtain a confirmed analytical result. Gas chromatography / mass spectrometry (GC/MS) is the preferred confirmat ory method. Performed at [facility], [address] [place], [place] [address]   SARS Coronavirus 2 (CEPHEID - Performed in [facility] hospital lab), Hosp Order     Status: None   Collection Time: 02/11/19 11:42 AM  Result Value Ref Range   SARS Coronavirus 2 NEGATIVE NEGATIVE    Comment: (NOTE) If result is NEGATIVE SARS-CoV-2 target nucleic acids are NOT DETECTED. The SARS-CoV-2 RNA is generally detectable in upper and lower  respiratory specimens during the acute phase of infection. The lowest  concentration of SARS-CoV-2 viral copies this assay can detect is 250  copies / mL. A negative result does not preclude SARS-CoV-2 infection  and should not be used as the sole basis for treatment or other  patient management decisions.  A negative result may occur with  improper specimen collection / handling, submission of specimen other  than nasopharyngeal swab, presence of viral mutation(s) within the  areas targeted by this assay, and inadequate number of viral copies  (<250 copies / mL). A negative result must be combined with clinical  observations, patient history, and  epidemiological information. If result is POSITIVE SARS-CoV-2 target nucleic acids are DETECTED. The SARS-CoV-2 RNA is generally detectable in upper and lower  respiratory specimens dur ing the acute phase of infection.  Positive  results are indicative of active infection with SARS-CoV-2.  Clinical  correlation with patient history and other diagnostic information is  necessary to determine patient infection status.  Positive results do  not rule out bacterial infection or co-infection with other viruses. If result is PRESUMPTIVE POSTIVE SARS-CoV-2 nucleic acids MAY BE PRESENT.   A presumptive positive result was obtained on the submitted specimen  and confirmed on repeat testing.  While 2019 novel coronavirus  (SARS-CoV-2) nucleic acids may be present in the submitted sample  additional confirmatory testing may be necessary for epidemiological  and / or clinical management purposes  to differentiate between  SARS-CoV-2 and other Sarbecovirus currently known to infect humans.  If clinically indicated additional testing with an alternate test  methodology [phone]) is advised. The SARS-CoV-2 RNA is generally  detectable in upper and lower respiratory sp ecimens during the acute  phase of infection. The expected result is Negative. Fact Sheet for Patients:  BoilerBrush.com.cy Fact Sheet for Healthcare Providers: https://pope.com/ This test is not yet approved or cleared by the [place] FDA and has been authorized for detection and/or diagnosis of SARS-CoV-2 by FDA under an Emergency Use Authorization (EUA).  This EUA will remain in effect (meaning this test can be used) for the duration of the COVID-19 declaration under Section 564(b)(1) of the Act, 21 U.S.C. section 360bbb-3(b)(1), unless the authorization is terminated or revoked sooner. Performed at [facility], [address] [place], [place] [address]     Blood  Alcohol level:  Lab Results  Component Value Date   ETH 208 (H) 02/11/2019   ETH 153 (H) 12/25/2018    Metabolic Disorder Labs:  Lab Results  Component Value Date   HGBA1C 5.9 (H) 12/27/2018   MPG 122.63 12/27/2018   No results found for: PROLACTIN Lab Results  Component Value Date   CHOL 164 08/05/2016   TRIG 55 08/05/2016   HDL 70 08/05/2016   CHOLHDL 2.3 08/05/2016   VLDL 11 08/05/2016   LDLCALC 83 08/05/2016    Current Medications: Current Facility-Administered Medications  Medication Dose Route Frequency Provider Last Rate Last Dose  . acetaminophen (TYLENOL) tablet 650 mg  650 mg Oral Q6H PRN Thalia Party, MD      . alum & mag hydroxide-simeth (MAALOX/MYLANTA) 200-200-20 MG/5ML suspension 30 mL  30 mL Oral Q4H PRN Paliy, Alisa, MD      . amLODipine (NORVASC) tablet 5 mg  5 mg Oral Daily Thalia Party, MD   5 mg at 02/12/19 0846  . cholecalciferol (VITAMIN D3) tablet 1,000 Units  1,000 Units Oral Daily Thalia Party, MD   1,000 Units at 02/12/19 0846  . cyclobenzaprine (FLEXERIL) tablet 10 mg  10 mg Oral TID PRN Thalia Party, MD      . docusate sodium (COLACE) capsule 100 mg  100 mg Oral BID Thalia Party, MD   100 mg at 02/12/19 0847  . hydrOXYzine (ATARAX/VISTARIL) tablet 25 mg  25 mg Oral TID  PRN Thalia Party, MD      . loperamide (IMODIUM) capsule 2-4 mg  2-4 mg Oral PRN Thalia Party, MD      . LORazepam (ATIVAN) tablet 1 mg  1 mg Oral Q6H PRN Thalia Party, MD      . LORazepam (ATIVAN) tablet 1 mg  1 mg Oral QID Thalia Party, MD   1 mg at 02/12/19 0846   Followed by  . [START ON 03/11/19] LORazepam (ATIVAN) tablet 1 mg  1 mg Oral TID Thalia Party, MD       Followed by  . [START ON 02/14/2019] LORazepam (ATIVAN) tablet 1 mg  1 mg Oral BID Thalia Party, MD       Followed by  . [START ON 02/15/2019] LORazepam (ATIVAN) tablet 1 mg  1 mg Oral Daily Paliy, Alisa, MD      . magnesium hydroxide (MILK OF MAGNESIA) suspension 30 mL  30 mL Oral Daily PRN Thalia Party, MD      .  multivitamin with minerals tablet 1 tablet  1 tablet Oral Daily Thalia Party, MD   1 tablet at 02/12/19 0847  . ondansetron (ZOFRAN-ODT) disintegrating tablet 4 mg  4 mg Oral Q6H PRN Thalia Party, MD      . thiamine (VITAMIN B-1) tablet 100 mg  100 mg Oral Daily Thalia Party, MD   100 mg at 02/12/19 0846  . traZODone (DESYREL) tablet 50 mg  50 mg Oral QHS PRN Thalia Party, MD       PTA Medications: Medications Prior to Admission  Medication Sig Dispense Refill Last Dose  . amLODipine (NORVASC) 5 MG tablet Take 1 tablet (5 mg total) by mouth daily. 30 tablet 1 unknown at unknown  . cyclobenzaprine (FLEXERIL) 10 MG tablet Take 1 tablet (10 mg total) by mouth 3 (three) times daily as needed for muscle spasms. 30 tablet 0 prn at prn    Musculoskeletal: Strength & Muscle Tone: within normal limits Gait & Station: normal Patient leans: N/A  Psychiatric Specialty Exam: Physical Exam  ROS  Blood pressure 130/85, pulse 78, temperature 98.1 F (36.7 C), temperature source Oral, resp. rate 18, height 6' (1.829 m), weight 79.4 kg, SpO2 99 %.Body mass index is 23.73 kg/m.  General Appearance: Casual  Eye Contact:  Minimal  Speech:  Slow  Volume:  Decreased  Mood:  Depressed  Affect:  Constricted  Thought Process:  Disorganized  Orientation:  Full (Time, Place, and Person)  Thought Content:  Illogical  Suicidal Thoughts:  Yes.  without intent/plan  Homicidal Thoughts:  No  Memory:  Immediate;   Poor Recent;   Poor Remote;   Poor  Judgement:  Impaired  Insight:  Lacking  Psychomotor Activity:  Decreased  Concentration:  Concentration: Poor and Attention Span: Poor  Recall:  Poor  Fund of Knowledge:  Poor  Language:  Fair  Akathisia:  No  Handed:  Right  AIMS (if indicated):     Assets:  Desire for Improvement  ADL's:  Intact  Cognition:  WNL  Sleep:  Number of Hours: 5.15    Treatment Plan Summary: Daily contact with patient to assess and evaluate symptoms and progress in  treatment and Medication management  Observation Level/Precautions:  15 minute checks  Laboratory:  CMP wnl, except glucose 109. CBCwnl. BAL at 208. UDS positive for cocaine.  Lipid panel wnl.  Psychotherapy:  Participate in individual and group therapy.  Medications:  CIWA protocol  Consultations:  As needed  Discharge Concerns:  Stable  and safe  Estimated LOS:3-4  Other:     Physician Treatment Plan for Primary Diagnosis: <principal problem not specified> Long Term Goal(s): Improvement in symptoms so as ready for discharge  Short Term Goals: Ability to identify changes in lifestyle to reduce recurrence of condition will improve, Ability to verbalize feelings will improve, Ability to disclose and discuss suicidal ideas, Ability to demonstrate self-control will improve, Ability to identify and develop effective coping behaviors will improve, Ability to maintain clinical measurements within normal limits will improve, Compliance with prescribed medications will improve and Ability to identify triggers associated with substance abuse/mental health issues will improve  Physician Treatment Plan for Secondary Diagnosis: Active Problems:   Suicidal ideation  Long Term Goal(s): Improvement in symptoms so as ready for discharge  Short Term Goals: Ability to identify changes in lifestyle to reduce recurrence of condition will improve, Ability to verbalize feelings will improve, Ability to disclose and discuss suicidal ideas and Ability to demonstrate self-control will improve  I certify that inpatient services furnished can reasonably be expected to improve the patient's condition.    Patrick North, MD 5/24/20202:08 PM

## 2019-02-12 NOTE — [facility] Group Notes (Signed)
LCSW Group Therapy Note 02/12/2019 1:15pm  Type of Therapy and Topic: Group Therapy: Feelings Around Returning Home & Establishing a Supportive Framework and Supporting Oneself When Supports Not Available  Participation Level: Active  Description of Group:  Patients first processed thoughts and feelings about upcoming discharge. These included fears of upcoming changes, lack of change, new living environments, judgements and expectations from others and overall stigma of mental health issues. The group then discussed the definition of a supportive framework, what that looks and feels like, and how do to discern it from an unhealthy non-supportive network. The group identified different types of supports as well as what to do when your family/friends are less than helpful or unavailable  Therapeutic Goals  1. Patient will identify one healthy supportive network that they can use at discharge. 2. Patient will identify one factor of a supportive framework and how to tell it from an unhealthy network. 3. Patient able to identify one coping skill to use when they do not have positive supports from others. 4. Patient will demonstrate ability to communicate their needs through discussion and/or role plays.  Summary of Patient Progress:  The patient reported he feels better. Pt engaged during group session. As patients processed their anxiety about discharge and described healthy supports patient shared he is ready to be discharge to be with his granddaughter.  Patients identified at least one self-care tool they were willing to use after discharge.   Therapeutic Modalities Cognitive Behavioral Therapy Motivational Interviewing   Artemis Loyal  CUEBAS-COLON, LCSW 02/12/2019 12:04 PM

## 2019-02-12 NOTE — [facility] Suicide Risk Assessment (Signed)
[facility] Admission Suicide Risk Assessment   Nursing information obtained from:  Patient Demographic factors:  Male, Low socioeconomic status, Unemployed Current Mental Status:  NA Loss Factors:  Decline in physical health, Loss of significant relationship, Financial problems / change in socioeconomic status Historical Factors:  Anniversary of important loss Risk Reduction Factors:  Positive social support, Living with another person, especially a relative, Responsible for children under 63 years of age  Total Time spent with patient: 45 minutes Principal Problem: <principal problem not specified> Diagnosis:  Active Problems:   Suicidal ideation  Subjective Data: Patient is a 53 yo  Man with history of alcohol dependence presenting with suicidal ideations.  Continued Clinical Symptoms:  Alcohol Use Disorder Identification Test Final Score (AUDIT): 3 The "Alcohol Use Disorders Identification Test", Guidelines for Use in Primary Care, Second Edition.  World Science writer [facility]). Score between 0-7:  no or low risk or alcohol related problems. Score between 8-15:  moderate risk of alcohol related problems. Score between 16-19:  high risk of alcohol related problems. Score 20 or above:  warrants further diagnostic evaluation for alcohol dependence and treatment.   CLINICAL FACTORS:   Alcohol/Substance Abuse/Dependencies   Musculoskeletal: Strength & Muscle Tone: within normal limits Gait & Station: normal Patient leans: N/A  Psychiatric Specialty Exam: Physical Exam  ROS  Blood pressure 130/85, pulse 78, temperature 98.1 F (36.7 C), temperature source Oral, resp. rate 18, height 6' (1.829 m), weight 79.4 kg, SpO2 99 %.Body mass index is 23.73 kg/m.   General Appearance: Casual  Eye Contact:  Minimal  Speech:  Slow  Volume:  Decreased  Mood:  Depressed  Affect:  Constricted  Thought Process:  Disorganized  Orientation:  Full (Time, Place, and Person)  Thought Content:   Illogical  Suicidal Thoughts:  Yes.  without intent/plan  Homicidal Thoughts:  No  Memory:  Immediate;   Poor Recent;   Poor Remote;   Poor  Judgement:  Impaired  Insight:  Lacking  Psychomotor Activity:  Decreased  Concentration:  Concentration: Poor and Attention Span: Poor  Recall:  Poor  Fund of Knowledge:  Poor  Language:  Fair  Akathisia:  No  Handed:  Right  AIMS (if indicated):     Assets:  Desire for Improvement  ADL's:  Intact  Cognition:  WNL  Sleep:  Number of Hours: 5.15    Treatment Plan Summary: Daily contact with patient to assess and evaluate symptoms and progress in treatment and Medication management  Observation Level/Precautions:  15 minute checks  Laboratory:  CMP wnl, except glucose 109. CBCwnl. BAL at 208. UDS positive for cocaine.  Lipid panel wnl.  Psychotherapy:  Participate in individual and group therapy.  Medications:  CIWA protocol  Consultations:  As needed  Discharge Concerns:  Stable and safe  Estimated LOS:3-4  Other:     Physician Treatment Plan for Primary Diagnosis: <principal problem not specified> Long Term Goal(s): Improvement in symptoms so as ready for discharge  Short Term Goals: Ability to identify changes in lifestyle to reduce recurrence of condition will improve, Ability to verbalize feelings will improve, Ability to disclose and discuss suicidal ideas, Ability to demonstrate self-control will improve, Ability to identify and develop effective coping behaviors will improve, Ability to maintain clinical measurements within normal limits will improve, Compliance with prescribed medications will improve and Ability to identify triggers associated with substance abuse/mental health issues will improve  Physician Treatment Plan for Secondary Diagnosis: Active Problems:   Suicidal ideation  Long Term Goal(s):  Improvement in symptoms so as ready for discharge  Short Term Goals: Ability to identify changes in lifestyle to reduce  recurrence of condition will improve, Ability to verbalize feelings will improve, Ability to disclose and discuss suicidal ideas and Ability to demonstrate self-control will improve  COGNITIVE FEATURES THAT CONTRIBUTE TO RISK:  Thought constriction (tunnel vision)    SUICIDE RISK:   Moderate:  Frequent suicidal ideation with limited intensity, and duration, some specificity in terms of plans, no associated intent, good self-control, limited dysphoria/symptomatology, some risk factors present, and identifiable protective factors, including available and accessible social support.  PLAN OF CARE: see above  I certify that inpatient services furnished can reasonably be expected to improve the patient's condition.   Patrick NorthHimabindu Racheal Mathurin, MD 02/12/2019, 2:24 PM

## 2019-02-12 NOTE — Tx Team (Signed)
Initial Treatment Plan 02/12/2019 1:15 AM [name] TML:[id number]    PATIENT STRESSORS: Financial difficulties Health problems Loss of a daughter through death Medication change or noncompliance Substance abuse Traumatic event   PATIENT STRENGTHS: Average or above average intelligence Capable of independent living Communication skills Supportive family/friends   PATIENT IDENTIFIED PROBLEMS: Substance abuse  Depression  "learn to cope"  " focus in bigger picture"               DISCHARGE CRITERIA:  Ability to meet basic life and health needs Improved stabilization in mood, thinking, and/or behavior Medical problems require only outpatient monitoring Motivation to continue treatment in a less acute level of care  PRELIMINARY DISCHARGE PLAN: Attend aftercare/continuing care group Attend PHP/IOP Outpatient therapy Return to previous living arrangement  PATIENT/FAMILY INVOLVEMENT: This treatment plan has been presented to and reviewed with the patient, Sean Hoffman, and/or family member,  .  The patient and family have been given the opportunity to ask questions and make suggestions.  Bethann Punches, RN 02/12/2019, 1:15 AM

## 2019-02-12 NOTE — Progress Notes (Signed)
D. Pt is calm and cooperative- friendly, smiling upon initial interaction this am in med room- pt reports "feeling better" and states, "I have to do better for my granddaughter". Pt currently denies pain, SI/HI and AVH  A. Labs and vitals monitored. Pt compliant with medications. Pt supported emotionally and encouraged to express concerns and ask questions.   R. Pt remains safe with 15 minute checks. Will continue POC.

## 2019-02-12 NOTE — Progress Notes (Signed)
Patient observed interacting well with peers in the dayroom, attending groups- went outside this afternoon. Pt reports that he is 'ready to be discharged' and is looking forward to seeing his grandchildren.

## 2019-02-12 NOTE — Progress Notes (Signed)
Sean Hoffman is a 53 y.o. male Involuntary admitted for suicide ideations. Pt stated he has been sober for some time but relapsed on alcohol two days ago after he found out that his daughter who has been missing since last 2023-02-15 was dead. Pt also stated that he last many family members including his mother through death. 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 Pt uses walker to ambulate. Pt's alert and oriented, calm and cooperative with the admission process. Consents signed, skin/belongings search completed and pt oriented to unit. Pt stable at this time. Pt given the opportunity to express concerns and ask questions. Will  continue to monitor.

## 2019-02-12 NOTE — Progress Notes (Signed)
[place] NOVEL CORONAVIRUS (COVID-19) DAILY CHECK-OFF SYMPTOMS - answer yes or no to each - every day NO YES  Have you had a fever in the past 24 hours?  . Fever (Temp > 37.80C / 100F) X   Have you had any of these symptoms in the past 24 hours? . New Cough .  Sore Throat  .  Shortness of Breath .  Difficulty Breathing .  Unexplained Body Aches   X   Have you had any one of these symptoms in the past 24 hours not related to allergies?   . Runny Nose .  Nasal Congestion .  Sneezing   X   If you have had runny nose, nasal congestion, sneezing in the past 24 hours, has it worsened?  X   EXPOSURES - check yes or no X   Have you traveled outside the state in the past 14 days?  X   Have you been in contact with someone with a confirmed diagnosis of COVID-19 or PUI in the past 14 days without wearing appropriate PPE?  X   Have you been living in the same home as a person with confirmed diagnosis of COVID-19 or a PUI (household contact)?    X   Have you been diagnosed with COVID-19?    X              What to do next: Answered NO to all: Answered YES to anything:   Proceed with unit schedule Follow the [facility] Inpatient Flowsheet.   

## 2019-02-13 DIAGNOSIS — F332 Major depressive disorder, recurrent severe without psychotic features: Principal | ICD-10-CM

## 2019-02-13 MED ORDER — TRAZODONE HCL 50 MG PO TABS: 50.0000 mg | ORAL_TABLET | Freq: Every evening | ORAL | 1 refills | Status: AC | PRN

## 2019-02-13 MED ORDER — AMLODIPINE BESYLATE 5 MG PO TABS: 5.0000 mg | ORAL_TABLET | Freq: Every day | ORAL | 1 refills | Status: AC

## 2019-02-13 MED ORDER — THIAMINE HCL 100 MG PO TABS: 100.0000 mg | ORAL_TABLET | Freq: Every day | ORAL | 1 refills | Status: AC

## 2019-02-13 MED ORDER — VITAMIN D 25 MCG (1000 UNIT) PO TABS: 1000.0000 [IU] | ORAL_TABLET | Freq: Every day | ORAL | 1 refills | Status: AC

## 2019-02-13 NOTE — Discharge Summary (Signed)
Physician Discharge Summary Note  Patient:  Sean Hoffman is an 53 y.o., male MRN:  [id number] DOB:  [birth year] Patient phone:  [phone] (home)  Patient address:   [address] [place] [address],  Total Time spent with patient: 45 minutes  Date of Admission:  02/11/2019 Date of Discharge: Feb 14, 2019  Reason for Admission: Patient admitted through the emergency room where he presented intoxicated with reports of suicidal ideation to cut himself with a knife.  Principal Problem: Severe recurrent major depression without psychotic features [facility]) Discharge Diagnoses: Principal Problem:   Severe recurrent major depression without psychotic features (HCC) Active Problems:   Alcohol use disorder, severe, dependence (HCC)   Cocaine use disorder, moderate, dependence (HCC)   Substance induced mood disorder (HCC)   Suicidal ideation   Past Psychiatric History: Patient has a history of recurrent similar presentations.  Ongoing alcohol and cocaine use problems.  Has been able to maintain sobriety at times.  Frequently will get agitated aggressive and threatened suicide when he is intoxicated.  No history of suicide attempts when sober  Past Medical History:  Past Medical History:  Diagnosis Date  . Bipolar affective (HCC)   . History of hepatitis C   . Hypertension   . Noncompliance with medication regimen 12/28/2018  . Schizophrenia (HCC)   . Vitamin D deficiency     Past Surgical History:  Procedure Laterality Date  . BELOW KNEE LEG AMPUTATION Right   . INTRAMEDULLARY (IM) NAIL INTERTROCHANTERIC Right 12/26/2018   Procedure: ORIF Right femoral neck fracture.;  Surgeon: Myrene Galas, MD;  Location: [facility] OR;  Service: Orthopedics;  Laterality: Right;   Family History: History reviewed. No pertinent family history. Family Psychiatric  History: None reported Social History:  Social History   Substance and Sexual Activity  Alcohol Use Yes   Comment: "If I drink I'm just gonna  drink till I pass out. I binge drink."     Social History   Substance and Sexual Activity  Drug Use Yes  . Types: Cocaine, Marijuana    Social History   Socioeconomic History  . Marital status: Legally Separated    Spouse name: Not on file  . Number of children: Not on file  . Years of education: Not on file  . Highest education level: Not on file  Occupational History  . Not on file  Social Needs  . Financial resource strain: Not on file  . Food insecurity:    Worry: Not on file    Inability: Not on file  . Transportation needs:    Medical: Not on file    Non-medical: Not on file  Tobacco Use  . Smoking status: Current Every Day Smoker    Packs/day: 2.00    Types: Cigarettes, E-cigarettes  . Smokeless tobacco: Never Used  Substance and Sexual Activity  . Alcohol use: Yes    Comment: "If I drink I'm just gonna drink till I pass out. I binge drink."  . Drug use: Yes    Types: Cocaine, Marijuana  . Sexual activity: Yes    Birth control/protection: None  Lifestyle  . Physical activity:    Days per week: Not on file    Minutes per session: Not on file  . Stress: Not on file  Relationships  . Social connections:    Talks on phone: Not on file    Gets together: Not on file    Attends religious service: Not on file    Active member of club or  organization: Not on file    Attends meetings of clubs or organizations: Not on file    Relationship status: Not on file  Other Topics Concern  . Not on file  Social History Narrative  . Not on file    Hospital Course: Patient admitted to the psychiatric ward.  15-minute checks in place.  Patient did not show any dangerous or aggressive behavior during his time in the hospital.  No sign of withdrawal.  Patient was cooperative with treatment.  Denied any suicidal or homicidal ideation.  On reassessment today he is showing euthymic affect appropriate thinking and is clear and lucid in his behavior.  He agrees to return to 12-step  meetings and outpatient substance abuse treatment  Physical Findings: AIMS: Facial and Oral Movements Muscles of Facial Expression: None, normal Lips and Perioral Area: None, normal Jaw: None, normal Tongue: None, normal,Extremity Movements Upper (arms, wrists, hands, fingers): None, normal Lower (legs, knees, ankles, toes): None, normal, Trunk Movements Neck, shoulders, hips: None, normal, Overall Severity Severity of abnormal movements (highest score from questions above): None, normal Incapacitation due to abnormal movements: None, normal Patient's awareness of abnormal movements (rate only patient's report): No Awareness, Dental Status Current problems with teeth and/or dentures?: No Does patient usually wear dentures?: No  CIWA:  CIWA-Ar Total: 0 COWS:  COWS Total Score: 1  Musculoskeletal: Strength & Muscle Tone: within normal limits Gait & Station: normal Patient leans: N/A  Psychiatric Specialty Exam: Physical Exam  Nursing note and vitals reviewed. Constitutional: He appears well-developed and well-nourished.  HENT:  Head: Normocephalic and atraumatic.  Eyes: Pupils are equal, round, and reactive to light. Conjunctivae are normal.  Neck: Normal range of motion.  Cardiovascular: Regular rhythm and normal heart sounds.  Respiratory: Effort normal. No respiratory distress.  GI: Soft.  Musculoskeletal: Normal range of motion.  Neurological: He is alert.  Skin: Skin is warm and dry.  Psychiatric: He has a normal mood and affect. His behavior is normal. Judgment and thought content normal.    Review of Systems  Constitutional: Negative.   HENT: Negative.   Eyes: Negative.   Respiratory: Negative.   Cardiovascular: Negative.   Gastrointestinal: Negative.   Musculoskeletal: Negative.   Skin: Negative.   Neurological: Negative.   Psychiatric/Behavioral: Negative.     Blood pressure 136/88, pulse 66, temperature 98.2 F (36.8 C), temperature source Oral, resp. rate  18, height 6' (1.829 m), weight 79.4 kg, SpO2 100 %.Body mass index is 23.73 kg/m.  General Appearance: Fairly Groomed  Eye Contact:  Good  Speech:  Clear and Coherent  Volume:  Normal  Mood:  Euthymic  Affect:  Congruent  Thought Process:  Goal Directed  Orientation:  Full (Time, Place, and Person)  Thought Content:  Logical  Suicidal Thoughts:  No  Homicidal Thoughts:  No  Memory:  Immediate;   Fair Recent;   Fair Remote;   Fair  Judgement:  Fair  Insight:  Fair  Psychomotor Activity:  Normal  Concentration:  Concentration: Fair  Recall:  [employer]  Fund of Knowledge:  Fair  Language:  Fair  Akathisia:  Negative  Handed:  Right  AIMS (if indicated):     Assets:  Desire for Improvement Housing Social Support  ADL's:  Intact  Cognition:  WNL  Sleep:  Number of Hours: 7.5        Has this patient used any form of tobacco in the last 30 days? (Cigarettes, Smokeless Tobacco, Cigars, and/or Pipes) Yes, Yes, A prescription for  an FDA-approved tobacco cessation medication was offered at discharge and the patient refused  Blood Alcohol level:  Lab Results  Component Value Date   ETH 208 (H) 02/11/2019   ETH 153 (H) 12/25/2018    Metabolic Disorder Labs:  Lab Results  Component Value Date   HGBA1C 5.9 (H) 12/27/2018   MPG 122.63 12/27/2018   No results found for: PROLACTIN Lab Results  Component Value Date   CHOL 164 08/05/2016   TRIG 55 08/05/2016   HDL 70 08/05/2016   CHOLHDL 2.3 08/05/2016   VLDL 11 08/05/2016   LDLCALC 83 08/05/2016    See Psychiatric Specialty Exam and Suicide Risk Assessment completed by Attending Physician prior to discharge.  Discharge destination:  Home  Is patient on multiple antipsychotic therapies at discharge:  No   Has Patient had three or more failed trials of antipsychotic monotherapy by history:  No  Recommended Plan for Multiple Antipsychotic Therapies: NA  Discharge Instructions    Diet - low sodium heart healthy    Complete by:  As directed    Increase activity slowly   Complete by:  As directed      Allergies as of 2019/02/25   No Known Allergies     Medication List    STOP taking these medications   Vitamin D 125 MCG (5000 UT) Caps Replaced by:  cholecalciferol 25 MCG (1000 UT) tablet     TAKE these medications     Indication  amLODipine 5 MG tablet Commonly known as:  NORVASC Take 1 tablet (5 mg total) by mouth daily.  Indication:  High Blood Pressure Disorder   cholecalciferol 25 MCG (1000 UT) tablet Commonly known as:  VITAMIN D3 Take 1 tablet (1,000 Units total) by mouth daily. Start taking on:  Feb 14, 2019 Replaces:  Vitamin D 125 MCG (5000 UT) Caps  Indication:  Vitamin D Deficiency   cyclobenzaprine 10 MG tablet Commonly known as:  FLEXERIL Take 1 tablet (10 mg total) by mouth 3 (three) times daily as needed for muscle spasms.  Indication:  Muscle Spasm   thiamine 100 MG tablet Take 1 tablet (100 mg total) by mouth daily. Start taking on:  Feb 14, 2019  Indication:  Deficiency of Vitamin B1   traZODone 50 MG tablet Commonly known as:  DESYREL Take 1 tablet (50 mg total) by mouth at bedtime as needed for sleep.  Indication:  Trouble Sleeping        Follow-up recommendations:  Activity:  Activity as tolerated Diet:  Regular diet Other:  Continue going to 12-step meetings.  Follow-up with local mental health agencies and substance abuse service  Comments: Patient's mood is much improved.  Denies any suicidal thoughts.  Able to name multiple 12-step groups that he stays in touch with and articulate a clear intention and plan to stay sober.  Not showing any signs of withdrawal.  Patient will be discharged today after completing psychoeducation and supportive therapy and he is agreeable to going to outpatient treatment again.  Signed: Mordecai Rasmussen, MD 02-25-2019, 10:17 AM

## 2019-02-13 NOTE — Plan of Care (Signed)
  Problem: Coping: Goal: Coping ability will improve Outcome: Progressing   Problem: Safety: Goal: Ability to remain free from injury will improve Outcome: Progressing  Patient denies SI/HI/AVH thoughts are organized, and he remains free from injury on the unit.

## 2019-02-13 NOTE — [facility] Suicide Risk Assessment (Signed)
[facility] INPATIENT:  Family/Significant Other Suicide Prevention Education  Suicide Prevention Education:  Patient Refusal for Family/Significant Other Suicide Prevention Education: The patient Sean Hoffman has refused to provide written consent for family/significant other to be provided Family/Significant Other Suicide Prevention Education during admission and/or prior to discharge.  Physician notified.  SPE completed with pt, as pt refused to consent to family contact. SPI pamphlet provided to pt and pt was encouraged to share information with support network, ask questions, and talk about any concerns relating to SPE. Pt denies access to guns/firearms and verbalized understanding of information provided. Mobile Crisis information also provided to pt.   Harden Mo 02/25/19, 10:38 AM

## 2019-02-13 NOTE — [facility] Suicide Risk Assessment (Signed)
[facility] Discharge Suicide Risk Assessment   Principal Problem: Severe recurrent major depression without psychotic features [facility]) Discharge Diagnoses: Principal Problem:   Severe recurrent major depression without psychotic features (HCC) Active Problems:   Alcohol use disorder, severe, dependence (HCC)   Cocaine use disorder, moderate, dependence (HCC)   Substance induced mood disorder (HCC)   Suicidal ideation   Total Time spent with patient: 45 minutes  Musculoskeletal: Strength & Muscle Tone: within normal limits Gait & Station: normal Patient leans: N/A  Psychiatric Specialty Exam: Review of Systems  Constitutional: Negative.   HENT: Negative.   Eyes: Negative.   Respiratory: Negative.   Cardiovascular: Negative.   Gastrointestinal: Negative.   Musculoskeletal: Negative.   Skin: Negative.   Neurological: Negative.   Psychiatric/Behavioral: Negative.     Blood pressure 136/88, pulse 66, temperature 98.2 F (36.8 C), temperature source Oral, resp. rate 18, height 6' (1.829 m), weight 79.4 kg, SpO2 100 %.Body mass index is 23.73 kg/m.  General Appearance: Fairly Groomed  Patent attorney::  Good  Speech:  Normal Rate409  Volume:  Normal  Mood:  Euthymic  Affect:  Congruent  Thought Process:  Goal Directed  Orientation:  Full (Time, Place, and Person)  Thought Content:  Logical  Suicidal Thoughts:  No  Homicidal Thoughts:  No  Memory:  Immediate;   Fair Recent;   Fair Remote;   Fair  Judgement:  Fair  Insight:  Fair  Psychomotor Activity:  Decreased  Concentration:  Fair  Recall:  [employer] of Knowledge:Fair  Language: Fair  Akathisia:  No  Handed:  Right  AIMS (if indicated):     Assets:  Desire for Improvement Housing Physical Health Resilience Social Support  Sleep:  Number of Hours: 7.5  Cognition: WNL  ADL's:  Intact   Mental Status Per Nursing Assessment::   On Admission:  NA  Demographic Factors:  Male  Loss Factors: Loss of significant  relationship and Decline in physical health  Historical Factors: Impulsivity and Patient recently learned of the death of his daughter.  Risk Reduction Factors:   Responsible for children under 54 years of age, Sense of responsibility to family, Living with another person, especially a relative, Positive social support and Positive therapeutic relationship  Continued Clinical Symptoms:  Depression:   Comorbid alcohol abuse/dependence Alcohol/Substance Abuse/Dependencies  Cognitive Features That Contribute To Risk:  Thought constriction (tunnel vision)    Suicide Risk:  Minimal: No identifiable suicidal ideation.  Patients presenting with no risk factors but with morbid ruminations; may be classified as minimal risk based on the severity of the depressive symptoms    Plan Of Care/Follow-up recommendations:  Activity:  Activity as tolerated Diet:  Regular diet Other:  Patient will be referred to continue outpatient treatment in the community and in particular he will be supported in his idea of getting back involved with 12-step groups.  Mordecai Rasmussen, MD 2019/03/04, 10:12 AM

## 2019-02-13 NOTE — Progress Notes (Signed)
Patiet alert and oriented x 4. affected is flat but brightens upon approach , he denies SI/HI/AVH, thoughts are organized and coherent,  interacting with peers and staff appropriately,  compliant with prescribed medication regimen, no distress noted, 15 minutes safety checks maintained will continue to monitor.

## 2019-02-13 NOTE — Progress Notes (Signed)
Recreation Therapy Notes   Date: 02-26-2019  Time: 9:30 am   Location: Craft room   Behavioral response: N/A   Intervention Topic: Coping skills  Discussion/Intervention: Patient did not attend group.   Clinical Observations/Feedback:  Patient did not attend group.   Moriah Loughry LRT/CTRS        Jessice Madill 26-Feb-2019 11:00 AM

## 2019-02-13 NOTE — Tx Team (Signed)
Interdisciplinary Treatment and Diagnostic Plan Update  02-23-19 Time of Session: 9:00AM Sean Hoffman MRN: [id number]  Principal Diagnosis: Severe recurrent major depression without psychotic features [facility])  Secondary Diagnoses: Principal Problem:   Severe recurrent major depression without psychotic features (HCC) Active Problems:   Alcohol use disorder, severe, dependence (HCC)   Cocaine use disorder, moderate, dependence (HCC)   Substance induced mood disorder (HCC)   Suicidal ideation   Current Medications:  Current Facility-Administered Medications  Medication Dose Route Frequency Provider Last Rate Last Dose  . acetaminophen (TYLENOL) tablet 650 mg  650 mg Oral Q6H PRN Thalia Party, MD      . alum & mag hydroxide-simeth (MAALOX/MYLANTA) 200-200-20 MG/5ML suspension 30 mL  30 mL Oral Q4H PRN Paliy, Alisa, MD      . amLODipine (NORVASC) tablet 5 mg  5 mg Oral Daily Thalia Party, MD   5 mg at February 23, 2019 0921  . cholecalciferol (VITAMIN D3) tablet 1,000 Units  1,000 Units Oral Daily Thalia Party, MD   1,000 Units at February 23, 2019 8657  . cyclobenzaprine (FLEXERIL) tablet 10 mg  10 mg Oral TID PRN Thalia Party, MD      . docusate sodium (COLACE) capsule 100 mg  100 mg Oral BID Thalia Party, MD   100 mg at 02/12/19 0847  . hydrOXYzine (ATARAX/VISTARIL) tablet 25 mg  25 mg Oral TID PRN Thalia Party, MD      . loperamide (IMODIUM) capsule 2-4 mg  2-4 mg Oral PRN Thalia Party, MD      . LORazepam (ATIVAN) tablet 1 mg  1 mg Oral Q6H PRN Thalia Party, MD      . LORazepam (ATIVAN) tablet 1 mg  1 mg Oral TID Thalia Party, MD   1 mg at 02/23/19 8469   Followed by  . [START ON 02/14/2019] LORazepam (ATIVAN) tablet 1 mg  1 mg Oral BID Thalia Party, MD       Followed by  . [START ON 02/15/2019] LORazepam (ATIVAN) tablet 1 mg  1 mg Oral Daily Paliy, Alisa, MD      . magnesium hydroxide (MILK OF MAGNESIA) suspension 30 mL  30 mL Oral Daily PRN Thalia Party, MD      . multivitamin with minerals tablet 1  tablet  1 tablet Oral Daily Thalia Party, MD   1 tablet at 23-Feb-2019 0921  . ondansetron (ZOFRAN-ODT) disintegrating tablet 4 mg  4 mg Oral Q6H PRN Thalia Party, MD      . thiamine (VITAMIN B-1) tablet 100 mg  100 mg Oral Daily Thalia Party, MD   100 mg at 2019/02/23 6295  . traZODone (DESYREL) tablet 50 mg  50 mg Oral QHS PRN Thalia Party, MD   50 mg at 02/12/19 2242   PTA Medications: Medications Prior to Admission  Medication Sig Dispense Refill Last Dose  . cyclobenzaprine (FLEXERIL) 10 MG tablet Take 1 tablet (10 mg total) by mouth 3 (three) times daily as needed for muscle spasms. 30 tablet 0 prn at prn  . [DISCONTINUED] amLODipine (NORVASC) 5 MG tablet Take 1 tablet (5 mg total) by mouth daily. 30 tablet 1 unknown at unknown    Patient Stressors: Financial difficulties Health problems Loss of a daughter through death Medication change or noncompliance Substance abuse Traumatic event  Patient Strengths: Average or above average intelligence Capable of independent living Communication skills Supportive family/friends  Treatment Modalities: Medication Management, Group therapy, Case management,  1 to 1 session with clinician, Psychoeducation, Recreational therapy.   Physician Treatment Plan  for Primary Diagnosis: Severe recurrent major depression without psychotic features (HCC) Long Term Goal(s): Improvement in symptoms so as ready for discharge Improvement in symptoms so as ready for discharge   Short Term Goals: Ability to identify changes in lifestyle to reduce recurrence of condition will improve Ability to verbalize feelings will improve Ability to disclose and discuss suicidal ideas Ability to demonstrate self-control will improve Ability to identify and develop effective coping behaviors will improve Ability to maintain clinical measurements within normal limits will improve Compliance with prescribed medications will improve Ability to identify triggers associated  with substance abuse/mental health issues will improve Ability to identify changes in lifestyle to reduce recurrence of condition will improve Ability to verbalize feelings will improve Ability to disclose and discuss suicidal ideas Ability to demonstrate self-control will improve  Medication Management: Evaluate patient's response, side effects, and tolerance of medication regimen.  Therapeutic Interventions: 1 to 1 sessions, Unit Group sessions and Medication administration.  Evaluation of Outcomes: Adequate for Discharge  Physician Treatment Plan for Secondary Diagnosis: Principal Problem:   Severe recurrent major depression without psychotic features (HCC) Active Problems:   Alcohol use disorder, severe, dependence (HCC)   Cocaine use disorder, moderate, dependence (HCC)   Substance induced mood disorder (HCC)   Suicidal ideation  Long Term Goal(s): Improvement in symptoms so as ready for discharge Improvement in symptoms so as ready for discharge   Short Term Goals: Ability to identify changes in lifestyle to reduce recurrence of condition will improve Ability to verbalize feelings will improve Ability to disclose and discuss suicidal ideas Ability to demonstrate self-control will improve Ability to identify and develop effective coping behaviors will improve Ability to maintain clinical measurements within normal limits will improve Compliance with prescribed medications will improve Ability to identify triggers associated with substance abuse/mental health issues will improve Ability to identify changes in lifestyle to reduce recurrence of condition will improve Ability to verbalize feelings will improve Ability to disclose and discuss suicidal ideas Ability to demonstrate self-control will improve     Medication Management: Evaluate patient's response, side effects, and tolerance of medication regimen.  Therapeutic Interventions: 1 to 1 sessions, Unit Group sessions and  Medication administration.  Evaluation of Outcomes: Adequate for Discharge   RN Treatment Plan for Primary Diagnosis: Severe recurrent major depression without psychotic features (HCC) Long Term Goal(s): Knowledge of disease and therapeutic regimen to maintain health will improve  Short Term Goals: Ability to verbalize frustration and anger appropriately will improve, Ability to demonstrate self-control, Ability to participate in decision making will improve, Ability to verbalize feelings will improve, Ability to disclose and discuss suicidal ideas and Ability to identify and develop effective coping behaviors will improve  Medication Management: RN will administer medications as ordered by provider, will assess and evaluate patient's response and provide education to patient for prescribed medication. RN will report any adverse and/or side effects to prescribing provider.  Therapeutic Interventions: 1 on 1 counseling sessions, Psychoeducation, Medication administration, Evaluate responses to treatment, Monitor vital signs and CBGs as ordered, Perform/monitor CIWA, COWS, AIMS and Fall Risk screenings as ordered, Perform wound care treatments as ordered.  Evaluation of Outcomes: Adequate for Discharge   LCSW Treatment Plan for Primary Diagnosis: Severe recurrent major depression without psychotic features (HCC) Long Term Goal(s): Safe transition to appropriate next level of care at discharge, Engage patient in therapeutic group addressing interpersonal concerns.  Short Term Goals: Engage patient in aftercare planning with referrals and resources, Increase social support, Increase ability to appropriately  verbalize feelings, Increase emotional regulation, Facilitate patient progression through stages of change regarding substance use diagnoses and concerns and Identify triggers associated with mental health/substance abuse issues  Therapeutic Interventions: Assess for all discharge needs, 1 to 1  time with Social worker, Explore available resources and support systems, Assess for adequacy in community support network, Educate family and significant other(s) on suicide prevention, Complete Psychosocial Assessment, Interpersonal group therapy.  Evaluation of Outcomes: Adequate for Discharge   Progress in Treatment: Attending groups: Yes. Participating in groups: Yes. Taking medication as prescribed: Yes. Toleration medication: Yes. Family/Significant other contact made: No, will contact:  pt declined family contact. Patient understands diagnosis: Yes. Discussing patient identified problems/goals with staff: Yes. Medical problems stabilized or resolved: Yes. Denies suicidal/homicidal ideation: Yes. Issues/concerns per patient self-inventory: No. Other: none  New problem(s) identified: No, Describe:  none  New Short Term/Long Term Goal(s): detox, medication management for mood stabilization; elimination of SI thoughts; development of comprehensive mental wellness/sobriety plan.  Patient Goals:  "to get home"  Discharge Plan or Barriers: Pt rpeorts plans to return home. Patient declines needs for medication or transportation support.  Patient reports that he engages with [facility] in [place], [place].  Reason for Continuation of Hospitalization: Anxiety Depression Medication stabilization Suicidal ideation Withdrawal symptoms  Estimated Length of Stay: 1-5 days  Attendees: Patient: Sean Hoffman Oct 07, 2018 10:40 AM  Physician:  Dr. Toni Amendlapacs, MD Oct 07, 2018 10:40 AM  Nursing: Larina BrasShay Powell, RN Oct 07, 2018 10:40 AM  RN Care Manager: Oct 07, 2018 10:40 AM  Social Worker: Penni HomansMichaela Maytal Mijangos, LCSW Oct 07, 2018 10:40 AM  Recreational Therapist: Garret ReddishShay Outlaw, Drue FlirtRS, LRT Oct 07, 2018 10:40 AM  Other: Iris Pertlivia Moton, LCSW Oct 07, 2018 10:40 AM  Other:  Oct 07, 2018 10:40 AM  Other: Oct 07, 2018 10:40 AM    Scribe for Treatment Team: Harden MoMichaela J Veona Bittman, LCSW Oct 07, 2018 10:40 AM

## 2019-02-13 NOTE — Progress Notes (Signed)
  [facility] Adult Case Management Discharge Plan :  Will you be returning to the same living situation after discharge:  Yes,  pt reports that he is returning home.  At discharge, do you have transportation home?: Yes,  pt reports family will be picking her up.  Do you have the ability to pay for your medications: Yes,  Medicare  Release of information consent forms completed and in the chart;  Patient's signature needed at discharge.  Patient to Follow up at: Follow-up Information    [facility]. Call.   Why:  Please follow up with your next appointment.  Contact information:  [address],  [place], [place] [address] P: [phone]          Next level of care provider has access to [facility] Link:no  Safety Planning and Suicide Prevention discussed: No. Pt declined SPE at this time.      Has patient been referred to the Quitline?: Patient refused referral  Patient has been referred for addiction treatment: Pt. refused referral  Harden Mo, LCSW Mar 14, 2019, 10:35 AM

## 2019-02-20 NOTE — Death Summary Note (Signed)
Patient is alert and oriented X 4, denies SI, HI and AVH. Patient received follow up instructions as well as paper prescriptions. Patient verified belongings in [facility] locker and received them. Patient escorted to medical mall where family awaited to take patient back home. Patient rates pain 0/10.

## 2019-02-20 DEATH — deceased

## 2019-06-05 ENCOUNTER — Other Ambulatory Visit: Payer: Self-pay

## 2019-06-05 ENCOUNTER — Emergency Department
Admission: EM | Admit: 2019-06-05 | Discharge: 2019-06-06 | Disposition: A | Discharge status: Home / Self Care | Payer: Medicaid Other | Attending: Emergency Medicine | Admitting: Emergency Medicine

## 2019-06-05 DIAGNOSIS — Z79899 Other long term (current) drug therapy: Secondary | ICD-10-CM | POA: Diagnosis not present

## 2019-06-05 DIAGNOSIS — R0600 Dyspnea, unspecified: Secondary | ICD-10-CM | POA: Diagnosis present

## 2019-06-05 DIAGNOSIS — F10929 Alcohol use, unspecified with intoxication, unspecified: Secondary | ICD-10-CM | POA: Insufficient documentation

## 2019-06-05 DIAGNOSIS — I10 Essential (primary) hypertension: Secondary | ICD-10-CM | POA: Diagnosis not present

## 2019-06-05 DIAGNOSIS — F17210 Nicotine dependence, cigarettes, uncomplicated: Secondary | ICD-10-CM | POA: Insufficient documentation

## 2019-06-05 DIAGNOSIS — F1410 Cocaine abuse, uncomplicated: Secondary | ICD-10-CM | POA: Insufficient documentation

## 2019-06-05 DIAGNOSIS — Z20828 Contact with and (suspected) exposure to other viral communicable diseases: Secondary | ICD-10-CM | POA: Insufficient documentation

## 2019-06-05 NOTE — ED Notes (Signed)
EKG completed. Pt given remote to tv. Call bell within reach. Rails up. Bed locked low.

## 2019-06-05 NOTE — ED Triage Notes (Addendum)
Pt in via EMS from home d/t episodes of "passing out" per family. Family also stated to EMS that when pt sleeps he has periods of time when he appears to stop breathing. Pt has history of "seizure-like activity" and ETOH use. History HTN without taking BP meds. 158/92 BP and 90HR with EMS.

## 2019-06-06 ENCOUNTER — Emergency Department: Payer: Medicaid Other

## 2019-06-06 LAB — CBC WITH DIFFERENTIAL/PLATELET
Abs Immature Granulocytes: 0.03 10*3/uL (ref 0.00–0.07)
Basophils Absolute: 0 10*3/uL (ref 0.0–0.1)
Basophils Relative: 0 %
Eosinophils Absolute: 0.1 10*3/uL (ref 0.0–0.5)
Eosinophils Relative: 1 %
HCT: 43.7 % (ref 39.0–52.0)
Hemoglobin: 14.4 g/dL (ref 13.0–17.0)
Immature Granulocytes: 0 %
Lymphocytes Relative: 44 %
Lymphs Abs: 4.1 10*3/uL — ABNORMAL HIGH (ref 0.7–4.0)
MCH: 27.3 pg (ref 26.0–34.0)
MCHC: 33 g/dL (ref 30.0–36.0)
MCV: 82.8 fL (ref 80.0–100.0)
Monocytes Absolute: 0.7 10*3/uL (ref 0.1–1.0)
Monocytes Relative: 7 %
Neutro Abs: 4.4 10*3/uL (ref 1.7–7.7)
Neutrophils Relative %: 48 %
Platelets: 255 10*3/uL (ref 150–400)
RBC: 5.28 MIL/uL (ref 4.22–5.81)
RDW: 14.5 % (ref 11.5–15.5)
WBC: 9.3 10*3/uL (ref 4.0–10.5)
nRBC: 0 % (ref 0.0–0.2)

## 2019-06-06 LAB — URINALYSIS, ROUTINE W REFLEX MICROSCOPIC
Bilirubin Urine: NEGATIVE
Glucose, UA: NEGATIVE mg/dL
Hgb urine dipstick: NEGATIVE
Ketones, ur: NEGATIVE mg/dL
Leukocytes,Ua: NEGATIVE
Nitrite: NEGATIVE
Protein, ur: NEGATIVE mg/dL
Specific Gravity, Urine: 1.006 (ref 1.005–1.030)
pH: 5 (ref 5.0–8.0)

## 2019-06-06 LAB — SARS CORONAVIRUS 2 BY RT PCR (HOSPITAL ORDER, PERFORMED IN ~~LOC~~ HOSPITAL LAB): SARS Coronavirus 2: NEGATIVE

## 2019-06-06 LAB — URINE DRUG SCREEN, QUALITATIVE (ARMC ONLY)
Amphetamines, Ur Screen: NOT DETECTED
Barbiturates, Ur Screen: NOT DETECTED
Benzodiazepine, Ur Scrn: NOT DETECTED
Cannabinoid 50 Ng, Ur ~~LOC~~: NOT DETECTED
Cocaine Metabolite,Ur ~~LOC~~: POSITIVE — AB
MDMA (Ecstasy)Ur Screen: NOT DETECTED
Methadone Scn, Ur: NOT DETECTED
Opiate, Ur Screen: NOT DETECTED
Phencyclidine (PCP) Ur S: NOT DETECTED
Tricyclic, Ur Screen: NOT DETECTED

## 2019-06-06 LAB — COMPREHENSIVE METABOLIC PANEL
ALT: 15 U/L (ref 0–44)
AST: 23 U/L (ref 15–41)
Albumin: 3.9 g/dL (ref 3.5–5.0)
Alkaline Phosphatase: 70 U/L (ref 38–126)
Anion gap: 10 (ref 5–15)
BUN: 7 mg/dL (ref 6–20)
CO2: 25 mmol/L (ref 22–32)
Calcium: 9.2 mg/dL (ref 8.9–10.3)
Chloride: 106 mmol/L (ref 98–111)
Creatinine, Ser: 0.87 mg/dL (ref 0.61–1.24)
GFR calc Af Amer: >60 mL/min (ref >60)
GFR calc non Af Amer: >60 mL/min (ref >60)
Glucose, Bld: 112 mg/dL — ABNORMAL HIGH (ref 70–99)
Potassium: 3.7 mmol/L (ref 3.5–5.1)
Sodium: 141 mmol/L (ref 135–145)
Total Bilirubin: 0.6 mg/dL (ref 0.3–1.2)
Total Protein: 7.3 g/dL (ref 6.5–8.1)

## 2019-06-06 LAB — ETHANOL: Alcohol, Ethyl (B): 194 mg/dL — ABNORMAL HIGH (ref <10)

## 2019-06-06 NOTE — ED Provider Notes (Signed)
[facility] Emergency Department Provider Note  ____________________________________________   First MD Initiated Contact with Patient 06/06/19 0007     (approximate)  I have reviewed the triage vital signs and the nursing notes.   HISTORY  Chief Complaint Respiratory Distress   Level 5 caveat:  history/ROS limited by acute intoxication   HPI Sean Hoffman is a 53 y.o. male with medical history as listed below with multiple relatively recent visits to the ED for psychiatric issues.  He presents tonight by EMS after reportedly passing out.  He admits to significant alcohol use tonight and is quite sleepy and just wants to be left alone.  He says he does not know why his family called EMS.  He says that he felt short of breath briefly after he had been drinking and he jumped up and moved around too fast but he does not feel short of breath now.   He has not had a sore throat recently and denies any contact with known COVID-19 patients.  He denies fever/chills, chest pain, nausea, vomiting, abdominal pain, and dysuria.  He denies any other drug use, just the alcohol.        Past Medical History:  Diagnosis Date  . Bipolar affective (HCC)   . History of hepatitis C   . Hypertension   . Noncompliance with medication regimen 12/28/2018  . Schizophrenia (HCC)   . Vitamin D deficiency     Patient Active Problem List   Diagnosis Date Noted  . Suicidal ideation 02/11/2019  . Vitamin D deficiency 01/09/2019  . Noncompliance with medication regimen 12/28/2018  . Hepatitis C antibody positive in blood 12/27/2018  . Closed right hip fracture (HCC) 12/26/2018  . Fracture of femoral neck, right (HCC) 12/26/2018  . Substance induced mood disorder (HCC) 10/09/2016  . HTN (hypertension) 08/06/2016  . Alcohol use disorder, severe, dependence (HCC) 08/05/2016  . Cocaine use disorder, moderate, dependence (HCC) 08/05/2016  . Tobacco use disorder 08/05/2016  . Severe  recurrent major depression without psychotic features (HCC) 08/04/2016    Past Surgical History:  Procedure Laterality Date  . BELOW KNEE LEG AMPUTATION Right   . INTRAMEDULLARY (IM) NAIL INTERTROCHANTERIC Right 12/26/2018   Procedure: ORIF Right femoral neck fracture.;  Surgeon: Myrene GalasHandy, Michael, MD;  [facility];  Service: Orthopedics;  Laterality: Right;    Prior to Admission medications   Medication Sig Start Date End Date Taking? Authorizing Provider  amLODipine (NORVASC) 5 MG tablet Take 1 tablet (5 mg total) by mouth daily. 02-Jan-2019   Clapacs, Jackquline DenmarkJohn T, MD  cholecalciferol (VITAMIN D3) 25 MCG (1000 UT) tablet Take 1 tablet (1,000 Units total) by mouth daily. 02/14/19   Clapacs, Jackquline DenmarkJohn T, MD  cyclobenzaprine (FLEXERIL) 10 MG tablet Take 1 tablet (10 mg total) by mouth 3 (three) times daily as needed for muscle spasms. 01/09/19   Storm FriskWright, Patrick E, MD  thiamine 100 MG tablet Take 1 tablet (100 mg total) by mouth daily. 02/14/19   Clapacs, Jackquline DenmarkJohn T, MD  traZODone (DESYREL) 50 MG tablet Take 1 tablet (50 mg total) by mouth at bedtime as needed for sleep. 02-Jan-2019   Clapacs, Jackquline DenmarkJohn T, MD    Allergies Patient has no known allergies.  History reviewed. No pertinent family history.  Social History Social History   Tobacco Use  . Smoking status: Current Every Day Smoker    Packs/day: 2.00    Types: Cigarettes, E-cigarettes  . Smokeless tobacco: Never Used  Substance Use Topics  . Alcohol  use: Yes    Comment: "If I drink I'm just gonna drink till I pass out. I binge drink."  . Drug use: Yes    Types: Cocaine    Review of Systems Constitutional: No fever/chills Eyes: No visual changes. ENT: No sore throat. Cardiovascular: Denies chest pain.  Reportedly passed out after drinking alcohol. Respiratory: Patient has some shortness of breath and cough earlier but the shortness of breath has resolved. Gastrointestinal: No abdominal pain.  No nausea, no vomiting.  No diarrhea.  No constipation.  Genitourinary: Negative for dysuria. Musculoskeletal: Negative for neck pain.  Negative for back pain. Integumentary: Negative for rash. Neurological: Negative for headaches, focal weakness or numbness.   ____________________________________________   PHYSICAL EXAM:  VITAL SIGNS: ED Triage Vitals  Enc Vitals Group     BP 06/05/19 2340 (!) 137/97     Pulse Rate 06/05/19 2342 85     Resp 06/05/19 2340 (!) 22     Temp 06/05/19 2341 98.1 F (36.7 C)     Temp Source 06/05/19 2341 Oral     SpO2 06/05/19 2342 98 %     Weight 06/05/19 2342 78.5 kg (173 lb)     Height 06/05/19 2342 1.854 m (6\' 1" )     Head Circumference --      Peak Flow --      Pain Score 06/05/19 2342 0     Pain Loc --      Pain Edu? --      Excl. in GC? --     Constitutional: Alert and oriented but somnolent and appears to be most likely intoxicated.  However he is in no distress. Eyes: Conjunctivae are normal.  Head: Atraumatic. Nose: No congestion/rhinnorhea. Mouth/Throat: Mucous membranes are moist. Neck: No stridor.  No meningeal signs.   Cardiovascular: Normal rate, regular rhythm. Good peripheral circulation. Grossly normal heart sounds. Respiratory: Normal respiratory effort.  No retractions. Gastrointestinal: Soft and nontender. No distention.  Musculoskeletal: No lower extremity tenderness nor edema. No gross deformities of extremities. Neurologic: Slurred speech and language. No gross focal neurologic deficits are appreciated.  Skin:  Skin is warm, dry and intact.   ____________________________________________   LABS (all labs ordered are listed, but only abnormal results are displayed)  Labs Reviewed  CBC WITH DIFFERENTIAL/PLATELET - Abnormal; Notable for the following components:      Result Value   Lymphs Abs 4.1 (*)    All other components within normal limits  COMPREHENSIVE METABOLIC PANEL - Abnormal; Notable for the following components:   Glucose, Bld 112 (*)    All other  components within normal limits  URINE DRUG SCREEN, QUALITATIVE ([facility] ONLY) - Abnormal; Notable for the following components:   Cocaine Metabolite,Ur [place] POSITIVE (*)    All other components within normal limits  URINALYSIS, ROUTINE W REFLEX MICROSCOPIC - Abnormal; Notable for the following components:   Color, Urine YELLOW (*)    APPearance CLEAR (*)    All other components within normal limits  ETHANOL - Abnormal; Notable for the following components:   Alcohol, Ethyl (B) 194 (*)    All other components within normal limits  SARS CORONAVIRUS 2 (HOSPITAL ORDER, PERFORMED IN [place] [facility])   ____________________________________________  EKG  ED ECG REPORT I, Loleta Roseory Eddith Mentor, the attending physician, personally viewed and interpreted this ECG.  Date: 06/05/2019 EKG Time: 23: 42 Rate: 83 Rhythm: normal sinus rhythm QRS Axis: normal Intervals: Incomplete right bundle branch block ST/T Wave abnormalities: Nonspecific ST segment changes with  no clear evidence of ischemia Narrative Interpretation: no evidence of acute ischemia  ____________________________________________  RADIOLOGY I, Hinda Kehr, personally viewed and evaluated these images (plain radiographs) as part of my medical decision making, as well as reviewing the written report by the radiologist.  ED MD interpretation: No acute abnormality identified on chest x-ray  Official radiology report(s): Dg Chest Portable 1 View  Result Date: 06/06/2019 CLINICAL DATA:  Shortness of breath EXAM: PORTABLE CHEST 1 VIEW COMPARISON:  12/25/2018 FINDINGS: Cardiac shadow is within normal limits. The lungs are well aerated bilaterally. No focal infiltrate or sizable effusion is seen. IMPRESSION: No active disease. Electronically Signed   By: Inez Catalina M.D.   On: 06/06/2019 00:20    ____________________________________________   PROCEDURES   Procedure(s) performed (including Critical Care):  Procedures    ____________________________________________   INITIAL IMPRESSION / MDM / [place] / ED COURSE  As part of my medical decision making, I reviewed the following data within the [place] notes reviewed and incorporated, Labs reviewed , EKG interpreted , Old chart reviewed, Radiograph reviewed , Notes from prior ED visits and [place] Controlled Substance Database   Differential diagnosis includes, but is not limited to, alcohol intoxication, sleep apnea, COVID-19, pneumonia, COPD.  I reviewed the medical record and most of the patient's issues seem to be psych related but he is acting appropriate currently, just appears intoxicated and sleepy.  His affect is appropriate, vital signs are stable, he has no increased work of breathing at this time but he does say he has some shortness of breath earlier.  Given the lack of clarity of the situation I am going to work him up with labs, chest x-ray, EKG, and a COVID-19 swab and then reassess.  Regardless he will not be able to go immediately after medical clearance so we will watch him for a little bit and then determine the appropriate time he can be picked up by a sober adult if there is no evidence of an acute or emergent medical condition.  He agreed with the plan when I discussed it with him, thanked me, and pulled the blanket back over his head.      Clinical Course as of Jun 05 548  Tue Jun 06, 2019  0213 Alcohol, Ethyl (B)(!): 194 [CF]  0213 Normal CBC  CBC with Differential/Platelet(!) [CF]  0213 Normal CMP  Comprehensive metabolic panel(!) [CF]  1941 SARS Coronavirus 2: NEGATIVE [CF]  0235 Normal UA  Urinalysis, Routine w reflex microscopic(!) [CF]  0311 Cocaine Metabolite,Ur [place](!): POSITIVE [CF]  [id number] Patient has been quiet, calm, cooperative, and sleeping throughout the night.  No evidence of an emergent medical condition.  The patient is appropriate for discharge if a sober adult can come pick him up.  We  will begin that process.   [CF]    Clinical Course User Index [CF] Hinda Kehr, MD     ____________________________________________  FINAL CLINICAL IMPRESSION(S) / ED DIAGNOSES  Final diagnoses:  Alcoholic intoxication with complication ([place])  Cocaine abuse ([place])     MEDICATIONS GIVEN DURING THIS VISIT:  Medications - No data to display   ED Discharge Orders    None      *Please note:  Kwane Rohl was evaluated in Emergency Department on 06/06/2019 for the symptoms described in the history of present illness. He was evaluated in the context of the global COVID-19 pandemic, which necessitated consideration that the patient might be at risk for infection with  the SARS-CoV-2 virus that causes COVID-19. Institutional protocols and algorithms that pertain to the evaluation of patients at risk for COVID-19 are in a state of rapid change based on information released by regulatory bodies including the CDC and federal and state organizations. These policies and algorithms were followed during the patient's care in the ED.  Some ED evaluations and interventions may be delayed as a result of limited staffing during the pandemic.*  Note:  This document was prepared using Dragon voice recognition software and may include unintentional dictation errors.   Loleta Rose, MD 06/06/19 [phone]

## 2019-06-06 NOTE — ED Notes (Addendum)
Patient awoken to inquire about ride home. Patient able to recall number of family and called and spoke with them independently. Family to arrive to pick patient up around 0700.

## 2019-06-06 NOTE — ED Notes (Signed)
Pt notified that urine sample is needed 

## 2019-06-06 NOTE — ED Notes (Signed)
2nd red tube sent to lab.

## 2019-06-06 NOTE — ED Notes (Signed)
Patient ambulatory to lobby with steady gait and NAD noted. Verbalized understanding of discharge instructions and follow-up care.  

## 2019-06-06 NOTE — Discharge Instructions (Signed)
Your work-up tonight was reassuring except for the positive alcohol and cocaine in your system.  Otherwise there was no evidence of any emergent medical condition.  Please try to refrain from alcohol and drug use and follow-up with your doctor at the next available opportunity.  Return to the emergency department if you develop new or worsening symptoms that concern you.

## 2019-06-06 NOTE — ED Notes (Signed)
Patient given phone to call ride. Ambulatory in room with steady gait and NAD noted.

## 2019-06-06 NOTE — ED Notes (Signed)
Pt urinated 200cc; dark yellow; sample sent to lab.

## 2019-08-01 ENCOUNTER — Other Ambulatory Visit: Payer: Self-pay

## 2019-08-01 ENCOUNTER — Emergency Department
Admission: EM | Admit: 2019-08-01 | Discharge: 2019-08-02 | Disposition: A | Discharge status: Home / Self Care | Payer: Medicaid Other | Attending: Student | Admitting: Student

## 2019-08-01 ENCOUNTER — Encounter: Payer: Self-pay | Admitting: Emergency Medicine

## 2019-08-01 ENCOUNTER — Emergency Department: Payer: Medicaid Other

## 2019-08-01 DIAGNOSIS — I10 Essential (primary) hypertension: Secondary | ICD-10-CM | POA: Diagnosis not present

## 2019-08-01 DIAGNOSIS — F1010 Alcohol abuse, uncomplicated: Secondary | ICD-10-CM | POA: Diagnosis not present

## 2019-08-01 DIAGNOSIS — Y907 Blood alcohol level of 200-239 mg/100 ml: Secondary | ICD-10-CM | POA: Diagnosis not present

## 2019-08-01 DIAGNOSIS — Y929 Unspecified place or not applicable: Secondary | ICD-10-CM | POA: Diagnosis not present

## 2019-08-01 DIAGNOSIS — Z79899 Other long term (current) drug therapy: Secondary | ICD-10-CM | POA: Diagnosis not present

## 2019-08-01 DIAGNOSIS — S0181XA Laceration without foreign body of other part of head, initial encounter: Secondary | ICD-10-CM | POA: Diagnosis not present

## 2019-08-01 DIAGNOSIS — W228XXA Striking against or struck by other objects, initial encounter: Secondary | ICD-10-CM | POA: Insufficient documentation

## 2019-08-01 DIAGNOSIS — Y939 Activity, unspecified: Secondary | ICD-10-CM | POA: Diagnosis not present

## 2019-08-01 DIAGNOSIS — Z23 Encounter for immunization: Secondary | ICD-10-CM | POA: Insufficient documentation

## 2019-08-01 DIAGNOSIS — F209 Schizophrenia, unspecified: Secondary | ICD-10-CM | POA: Diagnosis not present

## 2019-08-01 DIAGNOSIS — Z046 Encounter for general psychiatric examination, requested by authority: Secondary | ICD-10-CM | POA: Diagnosis not present

## 2019-08-01 DIAGNOSIS — F17290 Nicotine dependence, other tobacco product, uncomplicated: Secondary | ICD-10-CM | POA: Insufficient documentation

## 2019-08-01 DIAGNOSIS — Y999 Unspecified external cause status: Secondary | ICD-10-CM | POA: Insufficient documentation

## 2019-08-01 DIAGNOSIS — S61511A Laceration without foreign body of right wrist, initial encounter: Secondary | ICD-10-CM | POA: Diagnosis not present

## 2019-08-01 DIAGNOSIS — S098XXA Other specified injuries of head, initial encounter: Secondary | ICD-10-CM | POA: Diagnosis present

## 2019-08-01 LAB — COMPREHENSIVE METABOLIC PANEL
ALT: 17 U/L (ref 0–44)
AST: 35 U/L (ref 15–41)
Albumin: 4 g/dL (ref 3.5–5.0)
Alkaline Phosphatase: 61 U/L (ref 38–126)
Anion gap: 11 (ref 5–15)
BUN: 7 mg/dL (ref 6–20)
CO2: 24 mmol/L (ref 22–32)
Calcium: 8.9 mg/dL (ref 8.9–10.3)
Chloride: 109 mmol/L (ref 98–111)
Creatinine, Ser: 0.86 mg/dL (ref 0.61–1.24)
GFR calc Af Amer: >60 mL/min (ref >60)
GFR calc non Af Amer: >60 mL/min (ref >60)
Glucose, Bld: 96 mg/dL (ref 70–99)
Potassium: 3.9 mmol/L (ref 3.5–5.1)
Sodium: 144 mmol/L (ref 135–145)
Total Bilirubin: 0.4 mg/dL (ref 0.3–1.2)
Total Protein: 7.5 g/dL (ref 6.5–8.1)

## 2019-08-01 LAB — CBC
HCT: 40.8 % (ref 39.0–52.0)
Hemoglobin: 13.2 g/dL (ref 13.0–17.0)
MCH: 27.2 pg (ref 26.0–34.0)
MCHC: 32.4 g/dL (ref 30.0–36.0)
MCV: 84 fL (ref 80.0–100.0)
Platelets: 260 10*3/uL (ref 150–400)
RBC: 4.86 MIL/uL (ref 4.22–5.81)
RDW: 14.7 % (ref 11.5–15.5)
WBC: 12.9 10*3/uL — ABNORMAL HIGH (ref 4.0–10.5)
nRBC: 0 % (ref 0.0–0.2)

## 2019-08-01 LAB — ETHANOL: Alcohol, Ethyl (B): 231 mg/dL — ABNORMAL HIGH (ref <10)

## 2019-08-01 LAB — ACETAMINOPHEN LEVEL: Acetaminophen (Tylenol), Serum: <10 ug/mL — ABNORMAL LOW (ref 10–30)

## 2019-08-01 LAB — SALICYLATE LEVEL: Salicylate Lvl: <7 mg/dL (ref 2.8–30.0)

## 2019-08-01 MED ORDER — LORAZEPAM 2 MG/ML IJ SOLN
2.0000 mg | Freq: Once | INTRAMUSCULAR | Status: AC
  Administered 2019-08-01: 2 mg via INTRAMUSCULAR
  Filled 2019-08-01: qty 1

## 2019-08-01 MED ORDER — TETANUS-DIPHTH-ACELL PERTUSSIS 5-2.5-18.5 LF-MCG/0.5 IM SUSP
0.5000 mL | Freq: Once | INTRAMUSCULAR | Status: AC
  Administered 2019-08-01: 23:00:00 0.5 mL via INTRAMUSCULAR
  Filled 2019-08-01: qty 0.5

## 2019-08-01 MED ORDER — HALOPERIDOL LACTATE 5 MG/ML IJ SOLN
5.0000 mg | Freq: Once | INTRAMUSCULAR | Status: AC
  Administered 2019-08-01: 21:00:00 5 mg via INTRAMUSCULAR
  Filled 2019-08-01: qty 1

## 2019-08-01 MED ORDER — DIPHENHYDRAMINE HCL 50 MG/ML IJ SOLN
25.0000 mg | Freq: Once | INTRAMUSCULAR | Status: AC
  Administered 2019-08-01: 21:00:00 25 mg via INTRAMUSCULAR
  Filled 2019-08-01: qty 1
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
## 2019-08-01 NOTE — ED Notes (Signed)
Forensic restraints removed at this time.  Patient more calm and resting.

## 2019-08-01 NOTE — ED Notes (Signed)
Add to personal belongings: 1 black colored cell phone.

## 2019-08-01 NOTE — ED Notes (Signed)
Pt BAL is significantly elevated ( BAL 231 @ 2224).  Per NP the pt has also been heavily medicated.   Pt will be assessed when he is no longer intoxicated/alert and oriented.

## 2019-08-01 NOTE — ED Notes (Signed)
Pt sleeping at this time, VSS. Officer present, will continue to 11min round and monitor.
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
## 2019-08-01 NOTE — ED Notes (Signed)
Pt sisters, Wallis Bamberg [phone].

## 2019-08-01 NOTE — ED Notes (Signed)
Pt back from CT. Pt resting comfortably at this time
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
## 2019-08-01 NOTE — ED Notes (Signed)
Patient changed into hospital appropriate scrubs at this time.  Belongings placed into 2 patient belonging bags.  Belongings included: 1 pair white socks, 1 pair long camo pants, 1 pair shoes, 1 black mask, 1 t shirt, 2 small cards and paperwork, 1 pair blue underwear.

## 2019-08-01 NOTE — ED Notes (Signed)
Pt to be transported to CT with EDT Vevelyn Royals and Officer at bedside. CT called and informed that pt would be coming.
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
## 2019-08-02 DIAGNOSIS — F1010 Alcohol abuse, uncomplicated: Secondary | ICD-10-CM | POA: Insufficient documentation

## 2019-08-02 LAB — URINE DRUG SCREEN, QUALITATIVE (ARMC ONLY)
Amphetamines, Ur Screen: NOT DETECTED
Barbiturates, Ur Screen: NOT DETECTED
Benzodiazepine, Ur Scrn: POSITIVE — AB
Cannabinoid 50 Ng, Ur ~~LOC~~: NOT DETECTED
Cocaine Metabolite,Ur ~~LOC~~: POSITIVE — AB
MDMA (Ecstasy)Ur Screen: NOT DETECTED
Methadone Scn, Ur: NOT DETECTED
Opiate, Ur Screen: NOT DETECTED
Phencyclidine (PCP) Ur S: NOT DETECTED
Tricyclic, Ur Screen: NOT DETECTED

## 2019-08-02 NOTE — ED Notes (Signed)
Received report from [facility]

## 2019-08-02 NOTE — ED Notes (Signed)
Pt's girlfriend called to pick up pt upon discharge.

## 2019-08-02 NOTE — ED Notes (Signed)
Pt resting quietly with eyes closed at this time, respirations equal and unlabored.  Psych eval pending at this time.

## 2019-08-02 NOTE — ED Provider Notes (Signed)
Patient has been cleared by psychiatry for discharge.   Earleen Newport, MD 08/02/19 1106

## 2019-08-02 NOTE — ED Notes (Signed)
Pt asleep, security at doorway.
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
## 2019-08-02 NOTE — ED Notes (Signed)
Pt provided breakfast tray at this time, pt continues to sleep. Will continue to monitor  

## 2019-08-02 NOTE — ED Notes (Signed)
Psych and TTS at bedside. 

## 2019-08-21 ENCOUNTER — Other Ambulatory Visit: Payer: Self-pay

## 2019-08-21 DIAGNOSIS — Z20822 Contact with and (suspected) exposure to covid-19: Secondary | ICD-10-CM

## 2019-08-22 LAB — NOVEL CORONAVIRUS, NAA: SARS-CoV-2, NAA: NOT DETECTED

## 2020-01-08 ENCOUNTER — Other Ambulatory Visit: Payer: Self-pay

## 2020-01-08 ENCOUNTER — Emergency Department
Admission: EM | Admit: 2020-01-08 | Discharge: 2020-01-08 | Disposition: A | Discharge status: Home / Self Care | Payer: Medicaid Other | Attending: Emergency Medicine | Admitting: Emergency Medicine

## 2020-01-08 DIAGNOSIS — F10229 Alcohol dependence with intoxication, unspecified: Secondary | ICD-10-CM | POA: Diagnosis not present

## 2020-01-08 DIAGNOSIS — F10929 Alcohol use, unspecified with intoxication, unspecified: Secondary | ICD-10-CM

## 2020-01-08 DIAGNOSIS — I10 Essential (primary) hypertension: Secondary | ICD-10-CM | POA: Insufficient documentation

## 2020-01-08 DIAGNOSIS — Z79899 Other long term (current) drug therapy: Secondary | ICD-10-CM | POA: Diagnosis not present

## 2020-01-08 DIAGNOSIS — F17210 Nicotine dependence, cigarettes, uncomplicated: Secondary | ICD-10-CM | POA: Diagnosis not present

## 2020-01-08 DIAGNOSIS — F1490 Cocaine use, unspecified, uncomplicated: Secondary | ICD-10-CM | POA: Insufficient documentation

## 2020-01-08 DIAGNOSIS — R4182 Altered mental status, unspecified: Secondary | ICD-10-CM | POA: Diagnosis present

## 2020-01-08 LAB — CBC WITH DIFFERENTIAL/PLATELET
Abs Immature Granulocytes: 0.01 10*3/uL (ref 0.00–0.07)
Basophils Absolute: 0.1 10*3/uL (ref 0.0–0.1)
Basophils Relative: 1 %
Eosinophils Absolute: 0 10*3/uL (ref 0.0–0.5)
Eosinophils Relative: 0 %
HCT: 41.9 % (ref 39.0–52.0)
Hemoglobin: 14 g/dL (ref 13.0–17.0)
Immature Granulocytes: 0 %
Lymphocytes Relative: 39 %
Lymphs Abs: 2.9 10*3/uL (ref 0.7–4.0)
MCH: 28.5 pg (ref 26.0–34.0)
MCHC: 33.4 g/dL (ref 30.0–36.0)
MCV: 85.3 fL (ref 80.0–100.0)
Monocytes Absolute: 0.5 10*3/uL (ref 0.1–1.0)
Monocytes Relative: 7 %
Neutro Abs: 3.9 10*3/uL (ref 1.7–7.7)
Neutrophils Relative %: 53 %
Platelets: 266 10*3/uL (ref 150–400)
RBC: 4.91 MIL/uL (ref 4.22–5.81)
RDW: 14.4 % (ref 11.5–15.5)
WBC: 7.4 10*3/uL (ref 4.0–10.5)
nRBC: 0 % (ref 0.0–0.2)

## 2020-01-08 LAB — COMPREHENSIVE METABOLIC PANEL
ALT: 18 U/L (ref 0–44)
AST: 32 U/L (ref 15–41)
Albumin: 4 g/dL (ref 3.5–5.0)
Alkaline Phosphatase: 73 U/L (ref 38–126)
Anion gap: 10 (ref 5–15)
BUN: 12 mg/dL (ref 6–20)
CO2: 28 mmol/L (ref 22–32)
Calcium: 9.1 mg/dL (ref 8.9–10.3)
Chloride: 105 mmol/L (ref 98–111)
Creatinine, Ser: 1.07 mg/dL (ref 0.61–1.24)
GFR calc Af Amer: >60 mL/min (ref >60)
GFR calc non Af Amer: >60 mL/min (ref >60)
Glucose, Bld: 106 mg/dL — ABNORMAL HIGH (ref 70–99)
Potassium: 3.6 mmol/L (ref 3.5–5.1)
Sodium: 143 mmol/L (ref 135–145)
Total Bilirubin: 0.7 mg/dL (ref 0.3–1.2)
Total Protein: 7.4 g/dL (ref 6.5–8.1)

## 2020-01-08 LAB — URINE DRUG SCREEN, QUALITATIVE (ARMC ONLY)
Amphetamines, Ur Screen: NOT DETECTED
Barbiturates, Ur Screen: NOT DETECTED
Benzodiazepine, Ur Scrn: POSITIVE — AB
Cannabinoid 50 Ng, Ur ~~LOC~~: NOT DETECTED
Cocaine Metabolite,Ur ~~LOC~~: POSITIVE — AB
MDMA (Ecstasy)Ur Screen: NOT DETECTED
Methadone Scn, Ur: NOT DETECTED
Opiate, Ur Screen: NOT DETECTED
Phencyclidine (PCP) Ur S: NOT DETECTED
Tricyclic, Ur Screen: NOT DETECTED

## 2020-01-08 LAB — ETHANOL: Alcohol, Ethyl (B): 248 mg/dL — ABNORMAL HIGH (ref <10)

## 2020-01-08 NOTE — ED Triage Notes (Signed)
EMS called out to home for seizure type activity. Patient was not having seizures per EMS patient was alert and thrashing on ground. Patient was given 2 mg Versed. EMS also states patient has been using crack and drinking x 2 days. Patient arrived sleeping with eyes closed. Patient arouses to name. Vitals WDL.

## 2020-01-08 NOTE — Discharge Instructions (Signed)
You have been seen in the Emergency Department (ED)  today for a psychiatric complaint.  You have been evaluated by psychiatry and we believe you are safe to be discharged from the hospital.   ° °Please return to the Emergency Department (ED)  immediately if you have ANY thoughts of hurting yourself or anyone else, so that we may help you. ° °Please avoid alcohol and drug use. ° °Follow up with your doctor and/or therapist as soon as possible regarding today's ED  visit.  ° °You may call crisis hotline for [place] at [phone]. ° °

## 2020-01-08 NOTE — ED Provider Notes (Signed)
-----------------------------------------   11:15 AM on 01/08/2020 -----------------------------------------  On reassessment, the patient is now alert and oriented.  He is clinically sober.  He states he feels somewhat sore but denies other acute complaints.  He states he has been feeling upset recently and was drinking, but denies any suicidal or homicidal ideation.  He has no other acute psychiatric or medical symptoms.  He states he feels comfortable going home.  At this time, there is no evidence of danger to self or others.  He is stable for discharge.  Return precautions given, and he expresses understanding.   Dionne Bucy, MD 01/08/20 1116
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
## 2020-09-14 ENCOUNTER — Emergency Department: Payer: Medicaid Other

## 2020-09-14 ENCOUNTER — Emergency Department
Admission: EM | Admit: 2020-09-14 | Discharge: 2020-09-15 | Disposition: A | Discharge status: Home / Self Care | Payer: Medicaid Other | Attending: Emergency Medicine | Admitting: Emergency Medicine

## 2020-09-14 DIAGNOSIS — F17210 Nicotine dependence, cigarettes, uncomplicated: Secondary | ICD-10-CM | POA: Insufficient documentation

## 2020-09-14 DIAGNOSIS — Z79899 Other long term (current) drug therapy: Secondary | ICD-10-CM | POA: Diagnosis not present

## 2020-09-14 DIAGNOSIS — R404 Transient alteration of awareness: Secondary | ICD-10-CM | POA: Diagnosis not present

## 2020-09-14 DIAGNOSIS — S99922A Unspecified injury of left foot, initial encounter: Secondary | ICD-10-CM | POA: Diagnosis not present

## 2020-09-14 DIAGNOSIS — I10 Essential (primary) hypertension: Secondary | ICD-10-CM | POA: Insufficient documentation

## 2020-09-14 DIAGNOSIS — R55 Syncope and collapse: Secondary | ICD-10-CM | POA: Diagnosis not present

## 2020-09-14 DIAGNOSIS — W19XXXA Unspecified fall, initial encounter: Secondary | ICD-10-CM | POA: Insufficient documentation
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
## 2020-09-14 NOTE — ED Provider Notes (Signed)
[facility] Emergency Department Provider Note   ____________________________________________   Event Date/Time   First MD Initiated Contact with Patient 09/14/20 2250     (approximate)  I have reviewed the triage vital signs and the nursing notes.   HISTORY  Chief Complaint Seizures    HPI Sean Hoffman is a 54 y.o. male with a stated past medical history of hypertension, schizophrenia, seizures, and alcohol abuse who presents via EMS with complaints of seizure-like activity.  Patient explains that he was drinking alcoholic beverages earlier tonight when he lost consciousness and fell forward hitting his face on the floor as well as getting his left foot caught in the chair he was sitting in.  Patient states that the next thing he remembers is EMS showing up.  After EMS arrived patient has been alert and oriented at his baseline immediately but they did notice patient having small episodes of arm spasm with grunting.  Patient refused IV or needle stick by EMS and is continuing to refuse all work-up here except for an x-ray of his left foot to evaluate for a left great toe injury.  Denies being on any medications for seizure activity.  Patient currently denies any vision changes, tinnitus, difficulty speaking, facial droop, sore throat, chest pain, shortness of breath, abdominal pain, nausea/vomiting/diarrhea, dysuria, or weakness/numbness/paresthesias in any extremity         Past Medical History:  Diagnosis Date  . Bipolar affective (HCC)   . History of hepatitis C   . Hypertension   . Noncompliance with medication regimen 12/28/2018  . Schizophrenia (HCC)   . Vitamin D deficiency     Patient Active Problem List   Diagnosis Date Noted  . Alcohol abuse   . Suicidal ideation 02/11/2019  . Vitamin D deficiency 01/09/2019  . Noncompliance with medication regimen 12/28/2018  . Hepatitis C antibody positive in blood 12/27/2018  . Closed right hip fracture  (HCC) 12/26/2018  . Fracture of femoral neck, right (HCC) 12/26/2018  . Substance induced mood disorder (HCC) 10/09/2016  . HTN (hypertension) 08/06/2016  . Alcohol use disorder, severe, dependence (HCC) 08/05/2016  . Cocaine use disorder, moderate, dependence (HCC) 08/05/2016  . Tobacco use disorder 08/05/2016  . Severe recurrent major depression without psychotic features (HCC) 08/04/2016    Past Surgical History:  Procedure Laterality Date  . BELOW KNEE LEG AMPUTATION Right   . INTRAMEDULLARY (IM) NAIL INTERTROCHANTERIC Right 12/26/2018   Procedure: ORIF Right femoral neck fracture.;  Surgeon: Myrene Galas, MD;  Location: [facility] OR;  Service: Orthopedics;  Laterality: Right;    Prior to Admission medications   Medication Sig Start Date End Date Taking? Authorizing Provider  amLODipine (NORVASC) 5 MG tablet Take 1 tablet (5 mg total) by mouth daily. 02-15-19   Clapacs, Jackquline Denmark, MD  cholecalciferol (VITAMIN D3) 25 MCG (1000 UT) tablet Take 1 tablet (1,000 Units total) by mouth daily. 02/14/19   Clapacs, Jackquline Denmark, MD  cyclobenzaprine (FLEXERIL) 10 MG tablet Take 1 tablet (10 mg total) by mouth 3 (three) times daily as needed for muscle spasms. 01/09/19   Storm Frisk, MD  thiamine 100 MG tablet Take 1 tablet (100 mg total) by mouth daily. 02/14/19   Clapacs, Jackquline Denmark, MD  traZODone (DESYREL) 50 MG tablet Take 1 tablet (50 mg total) by mouth at bedtime as needed for sleep. 02-15-19   Clapacs, Jackquline Denmark, MD    Allergies Patient has no known allergies.  No family history on file.  Social  History Social History   Tobacco Use  . Smoking status: Current Every Day Smoker    Packs/day: 2.00    Types: Cigarettes, E-cigarettes  . Smokeless tobacco: Never Used  Substance Use Topics  . Alcohol use: Yes    Comment: "If I drink I'm just gonna drink till I pass out. I binge drink."  . Drug use: Yes    Types: Cocaine    Comment: last use 04/2020    Review of Systems Constitutional: No  fever/chills Eyes: No visual changes. ENT: No sore throat. Cardiovascular: Denies chest pain. Respiratory: Denies shortness of breath. Gastrointestinal: No abdominal pain.  No nausea, no vomiting.  No diarrhea. Genitourinary: Negative for dysuria. Musculoskeletal: Negative for acute arthralgias Skin: Negative for rash. Neurological: Negative for headaches, weakness/numbness/paresthesias in any extremity Psychiatric: Negative for suicidal ideation/homicidal ideation   ____________________________________________   PHYSICAL EXAM:  VITAL SIGNS: ED Triage Vitals  Enc Vitals Group     BP 09/14/20 2221 129/87     Pulse Rate 09/14/20 2221 87     Resp 09/14/20 2221 16     Temp 09/14/20 2221 98.3 F (36.8 C)     Temp src --      SpO2 09/14/20 2221 95 %     Weight 09/14/20 2222 186 lb (84.4 kg)     Height 09/14/20 2222 6\' 1"  (1.854 m)     Head Circumference --      Peak Flow --      Pain Score 09/14/20 2222 0     Pain Loc --      Pain Edu? --      Excl. in GC? --    Constitutional: Alert and oriented. Well appearing and in no acute distress. Eyes: Conjunctivae are normal. PERRL. Head: Atraumatic. Nose: No congestion/rhinnorhea. Mouth/Throat: Mucous membranes are moist. Neck: No stridor Cardiovascular: Grossly normal heart sounds.  Good peripheral circulation. Respiratory: Normal respiratory effort.  No retractions. Gastrointestinal: Soft and nontender. No distention. Musculoskeletal: Right BKA, left foot first MTP joint with significant swelling and point tenderness over the dorsum Neurologic:  Normal speech and language. No gross focal neurologic deficits are appreciated. Skin:  Skin is warm and dry. No rash noted. Psychiatric: Mood and affect are normal. Speech and behavior are normal.  ____________________________________________   LABS (all labs ordered are listed, but only abnormal results are displayed)  Labs Reviewed - No data to display   RADIOLOGY  ED MD  interpretation: Three-view x-ray of the left foot shows no evidence of acute fractures or dislocations  Official radiology report(s): DG Foot Complete Left  Result Date: 09/14/2020 CLINICAL DATA:  Status post fall. EXAM: LEFT FOOT - COMPLETE 3+ VIEW COMPARISON:  None. FINDINGS: There is no evidence of an acute fracture or dislocation. Chronic changes are seen involving the distal aspect of the proximal phalanx of the left great toe and base of the distal phalanx. Soft tissues are unremarkable. IMPRESSION: No acute fracture or dislocation. Electronically Signed   By: 09/16/2020 M.D.   On: 09/14/2020 23:17    ____________________________________________   PROCEDURES  Procedure(s) performed (including Critical Care):  .1-3 Lead EKG Interpretation Performed by: 09/16/2020, MD Authorized by: Merwyn Katos, MD     Interpretation: normal     ECG rate:  87   ECG rate assessment: normal     Rhythm: sinus rhythm     Ectopy: none     Conduction: normal       ____________________________________________   INITIAL IMPRESSION /  ASSESSMENT AND PLAN / ED COURSE  As part of my medical decision making, I reviewed the following data within the electronic MEDICAL RECORD NUMBER Nursing notes reviewed and incorporated, Labs reviewed, EKG interpreted, Old chart reviewed, Radiograph reviewed and Notes from prior ED visits reviewed and incorporated        Patient presents with complaints of syncope/presyncope ED Workup:  Patient refused except for left foot x-ray with findings as above Differential diagnosis includes HF, ICH, seizure, stroke, HOCM, ACS, aortic dissection, malignant arrhythmia, or GI bleed. Findings: No fractures or dislocations in the left foot EKG: Patient refused  Disposition: Discharge. Patient is at baseline at this time. Return precautions expressed and understood in person. Advised follow up with primary care provider or clinic physician in next 24 hours.       ____________________________________________   FINAL CLINICAL IMPRESSION(S) / ED DIAGNOSES  Final diagnoses:  Transient alteration of awareness  Syncope, unspecified syncope type  Foot injury, left, initial encounter     ED Discharge Orders    None       Note:  This document was prepared using Dragon voice recognition software and may include unintentional dictation errors.   Merwyn Katos, MD 09/14/20 [phone]

## 2020-09-15 NOTE — ED Notes (Signed)
Upon returning to DC patient. Pt had eloped without signing DC paperwork. PT was educated about his results and was calling for a ride when RN was taken away for emergency traffic. Pt not in ED

## 2021-01-01 ENCOUNTER — Emergency Department
Admission: EM | Admit: 2021-01-01 | Discharge: 2021-01-01 | Disposition: A | Discharge status: Home / Self Care | Payer: Medicaid Other | Attending: Emergency Medicine | Admitting: Emergency Medicine

## 2021-01-01 ENCOUNTER — Other Ambulatory Visit: Payer: Self-pay

## 2021-01-01 DIAGNOSIS — X500XXA Overexertion from strenuous movement or load, initial encounter: Secondary | ICD-10-CM | POA: Insufficient documentation

## 2021-01-01 DIAGNOSIS — F17210 Nicotine dependence, cigarettes, uncomplicated: Secondary | ICD-10-CM | POA: Diagnosis not present

## 2021-01-01 DIAGNOSIS — M545 Low back pain, unspecified: Secondary | ICD-10-CM | POA: Diagnosis not present

## 2021-01-01 DIAGNOSIS — Z79899 Other long term (current) drug therapy: Secondary | ICD-10-CM | POA: Diagnosis not present

## 2021-01-01 DIAGNOSIS — I10 Essential (primary) hypertension: Secondary | ICD-10-CM | POA: Diagnosis not present

## 2021-01-01 MED ORDER — METHYLPREDNISOLONE 4 MG PO TBPK: ORAL_TABLET | ORAL | 0 refills | Status: AC

## 2021-01-01 MED ORDER — TRAMADOL HCL 50 MG PO TABS: 50.0000 mg | ORAL_TABLET | Freq: Four times a day (QID) | ORAL | 0 refills | Status: AC | PRN

## 2021-01-01 MED ORDER — KETOROLAC TROMETHAMINE 60 MG/2ML IM SOLN
60.0000 mg | Freq: Once | INTRAMUSCULAR | Status: AC
  Administered 2021-01-01: 60 mg via INTRAMUSCULAR
  Filled 2021-01-01: qty 2

## 2021-01-01 MED ORDER — CYCLOBENZAPRINE HCL 10 MG PO TABS: 10.0000 mg | ORAL_TABLET | Freq: Three times a day (TID) | ORAL | 0 refills | Status: AC | PRN

## 2021-01-01 MED ORDER — CYCLOBENZAPRINE HCL 10 MG PO TABS
10.0000 mg | ORAL_TABLET | Freq: Once | ORAL | Status: AC
  Administered 2021-01-01: 10 mg via ORAL
  Filled 2021-01-01: qty 1

## 2021-01-01 NOTE — ED Triage Notes (Signed)
Pt to ED POV for lower back pain x3 days. Denies loss of bowel or bladder function. States lifted a heavy bag and felt something pull.  Hx htn, has not taken meds for past couple days,.

## 2021-01-01 NOTE — ED Provider Notes (Signed)
[facility] Emergency Department Provider Note  ____________________________________________   Event Date/Time   First MD Initiated Contact with Patient 01/01/21 1136     (approximate)  I have reviewed the triage vital signs and the nursing notes.   HISTORY  Chief Complaint Back Pain    HPI Sean Hoffman is a 55 y.o. male  C/o low back pain for 3 day, positive known injury, patient states he was moving recently and pick up a large box with canned goods and felt his back pop, pain is worse with movement, increased with bending over, denies numbness, tingling, or changes in bowel/urinary habits,  Using otc meds without relief Remainder ros neg   Past Medical History:  Diagnosis Date  . Bipolar affective (HCC)   . History of hepatitis C   . Hypertension   . Noncompliance with medication regimen 12/28/2018  . Schizophrenia (HCC)   . Vitamin D deficiency     Patient Active Problem List   Diagnosis Date Noted  . Alcohol abuse   . Suicidal ideation 02/11/2019  . Vitamin D deficiency 01/09/2019  . Noncompliance with medication regimen 12/28/2018  . Hepatitis C antibody positive in blood 12/27/2018  . Closed right hip fracture (HCC) 12/26/2018  . Fracture of femoral neck, right (HCC) 12/26/2018  . Substance induced mood disorder (HCC) 10/09/2016  . HTN (hypertension) 08/06/2016  . Alcohol use disorder, severe, dependence (HCC) 08/05/2016  . Cocaine use disorder, moderate, dependence (HCC) 08/05/2016  . Tobacco use disorder 08/05/2016  . Severe recurrent major depression without psychotic features (HCC) 08/04/2016    Past Surgical History:  Procedure Laterality Date  . BELOW KNEE LEG AMPUTATION Right   . INTRAMEDULLARY (IM) NAIL INTERTROCHANTERIC Right 12/26/2018   Procedure: ORIF Right femoral neck fracture.;  Surgeon: Myrene Galas, MD;  Location: [facility] OR;  Service: Orthopedics;  Laterality: Right;    Prior to Admission medications   Medication Sig  Start Date End Date Taking? Authorizing Provider  cyclobenzaprine (FLEXERIL) 10 MG tablet Take 1 tablet (10 mg total) by mouth 3 (three) times daily as needed. 01/01/21  Yes Treanna Dumler, Roselyn Bering, PA-C  methylPREDNISolone (MEDROL DOSEPAK) 4 MG TBPK tablet Take 6 pills on day one then decrease by 1 pill each day 01/01/21  Yes Shahira Fiske, Roselyn Bering, PA-C  traMADol (ULTRAM) 50 MG tablet Take 1 tablet (50 mg total) by mouth every 6 (six) hours as needed. 01/01/21  Yes Valyncia Wiens, Roselyn Bering, PA-C  amLODipine (NORVASC) 5 MG tablet Take 1 tablet (5 mg total) by mouth daily. 2019-02-26   Clapacs, Jackquline Denmark, MD  cholecalciferol (VITAMIN D3) 25 MCG (1000 UT) tablet Take 1 tablet (1,000 Units total) by mouth daily. 02/14/19   Clapacs, Jackquline Denmark, MD  thiamine 100 MG tablet Take 1 tablet (100 mg total) by mouth daily. 02/14/19   Clapacs, Jackquline Denmark, MD  traZODone (DESYREL) 50 MG tablet Take 1 tablet (50 mg total) by mouth at bedtime as needed for sleep. 02/26/2019   Clapacs, Jackquline Denmark, MD    Allergies Patient has no known allergies.  No family history on file.  Social History Social History   Tobacco Use  . Smoking status: Current Every Day Smoker    Packs/day: 2.00    Types: Cigarettes, E-cigarettes  . Smokeless tobacco: Never Used  Substance Use Topics  . Alcohol use: Yes    Comment: "If I drink I'm just gonna drink till I pass out. I binge drink."  . Drug use: Not Currently  Types: Cocaine    Comment: last use 04/2020    Review of Systems  Constitutional: No fever/chills Eyes: No visual changes. ENT: No sore throat. Respiratory: Denies cough Genitourinary: Negative for dysuria. Musculoskeletal: Positive for back pain. Skin: Negative for rash.    ____________________________________________   PHYSICAL EXAM:  VITAL SIGNS: ED Triage Vitals  Enc Vitals Group     BP 01/01/21 1126 (!) 178/114     Pulse Rate 01/01/21 1126 76     Resp 01/01/21 1126 18     Temp 01/01/21 1126 97.6 F (36.4 C)     Temp Source 01/01/21  1126 Oral     SpO2 01/01/21 1126 98 %     Weight 01/01/21 1127 185 lb (83.9 kg)     Height 01/01/21 1127 6' (1.829 m)     Head Circumference --      Peak Flow --      Pain Score 01/01/21 1126 9     Pain Loc --      Pain Edu? --      Excl. in GC? --     Constitutional: Alert and oriented. Well appearing and in no acute distress. Eyes: Conjunctivae are normal.  Head: Atraumatic. Nose: No congestion/rhinnorhea. Mouth/Throat: Mucous membranes are moist.   Neck:  supple no lymphadenopathy noted Cardiovascular: Normal rate, regular rhythm.  Respiratory: Normal respiratory effort.  No retractions GU: deferred Musculoskeletal: FROM all extremities, warm and well perfused.  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  Skin:  Skin is warm, dry and intact. No rash noted. Psychiatric: Mood and affect are normal. Speech and behavior are normal.  ____________________________________________   LABS (all labs ordered are listed, but only abnormal results are displayed)  Labs Reviewed - No data to display ____________________________________________   ____________________________________________  RADIOLOGY    ____________________________________________   PROCEDURES  Procedure(s) performed: No  Procedures    ____________________________________________   INITIAL IMPRESSION / ASSESSMENT AND PLAN / ED COURSE  Pertinent labs & imaging results that were available during my care of the patient were reviewed by me and considered in my medical decision making (see chart for details).   Patient is 55 year old male presents with low back pain.  See HPI.  Physical exam shows patient appears stable.  Plan of care at this time is to give Toradol and Flexeril.  See if the patient has pain  relief.  If at that time he does not have pain relief we will do imaging.   Patient had relief with medications.  He is to follow-up with his regular doctor if not improving.  He was given prescriptions for Medrol Dosepak, Flexeril, and tramadol.  Return emergency department worsening.  Once he finishes steroid Dosepak he can go back to ibuprofen.  He states he understands.  He was discharged stable condition.  As part of my medical decision making, I reviewed the following data within the electronic MEDICAL RECORD NUMBER Nursing notes reviewed and incorporated, Old chart reviewed, Notes from prior ED visits and [place] Controlled Substance Database  ____________________________________________   FINAL CLINICAL IMPRESSION(S) / ED DIAGNOSES  Final diagnoses:  Acute midline low back pain without sciatica      NEW MEDICATIONS STARTED DURING THIS VISIT:  Discharge Medication List as of 01/01/2021 12:36 PM    START  taking these medications   Details  methylPREDNISolone (MEDROL DOSEPAK) 4 MG TBPK tablet Take 6 pills on day one then decrease by 1 pill each day, Normal    traMADol (ULTRAM) 50 MG tablet Take 1 tablet (50 mg total) by mouth every 6 (six) hours as needed., Starting Wed 01/01/2021, Normal         Note:  This document was prepared using Dragon voice recognition software and may include unintentional dictation errors.     Faythe Ghee, PA-C 01/01/21 1346    Jene Every, MD 01/01/21 [phone]
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
## 2022-08-22 IMAGING — CR DG FOOT COMPLETE 3+V*L*
1 series · 3 of 3 positions shown · non-contrast
Comparison: None.

CLINICAL DATA: Status post fall.

EXAM:
LEFT FOOT - COMPLETE 3+ VIEW

[Series 1: dg foot complete left · 0.14mm/px · 3 of 3 slices shown]
[im 1/3]
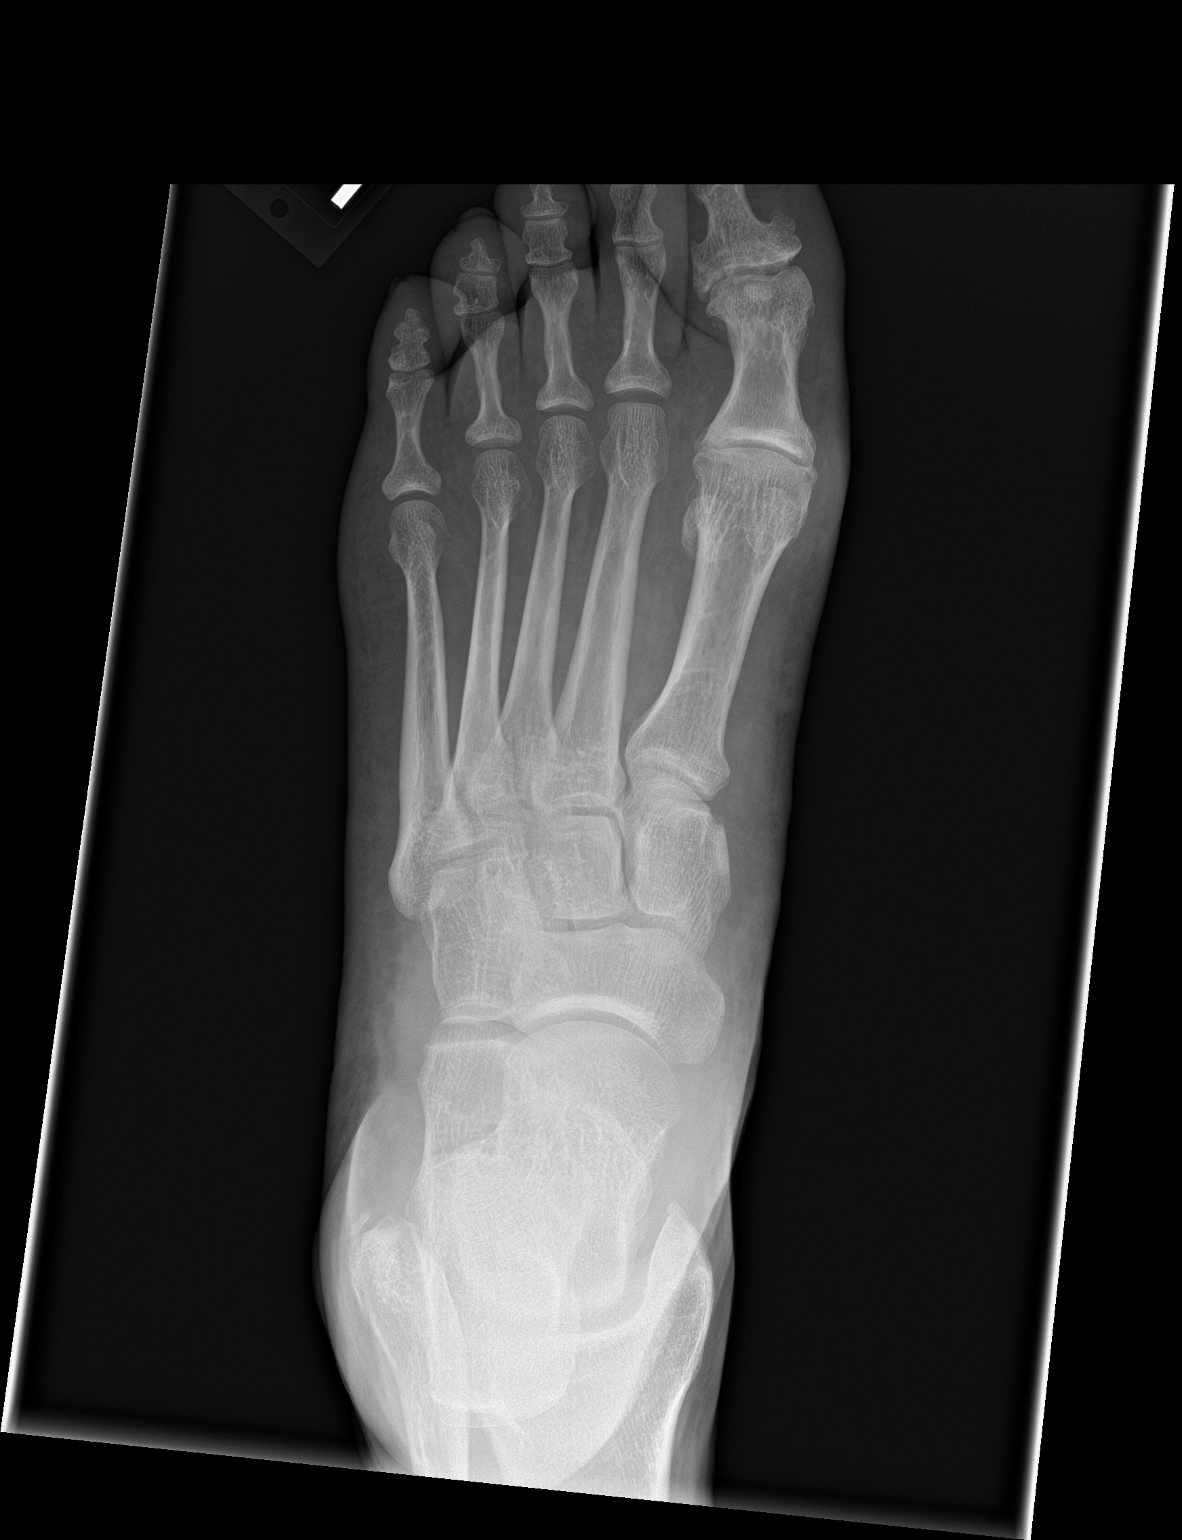
[im 2/3]
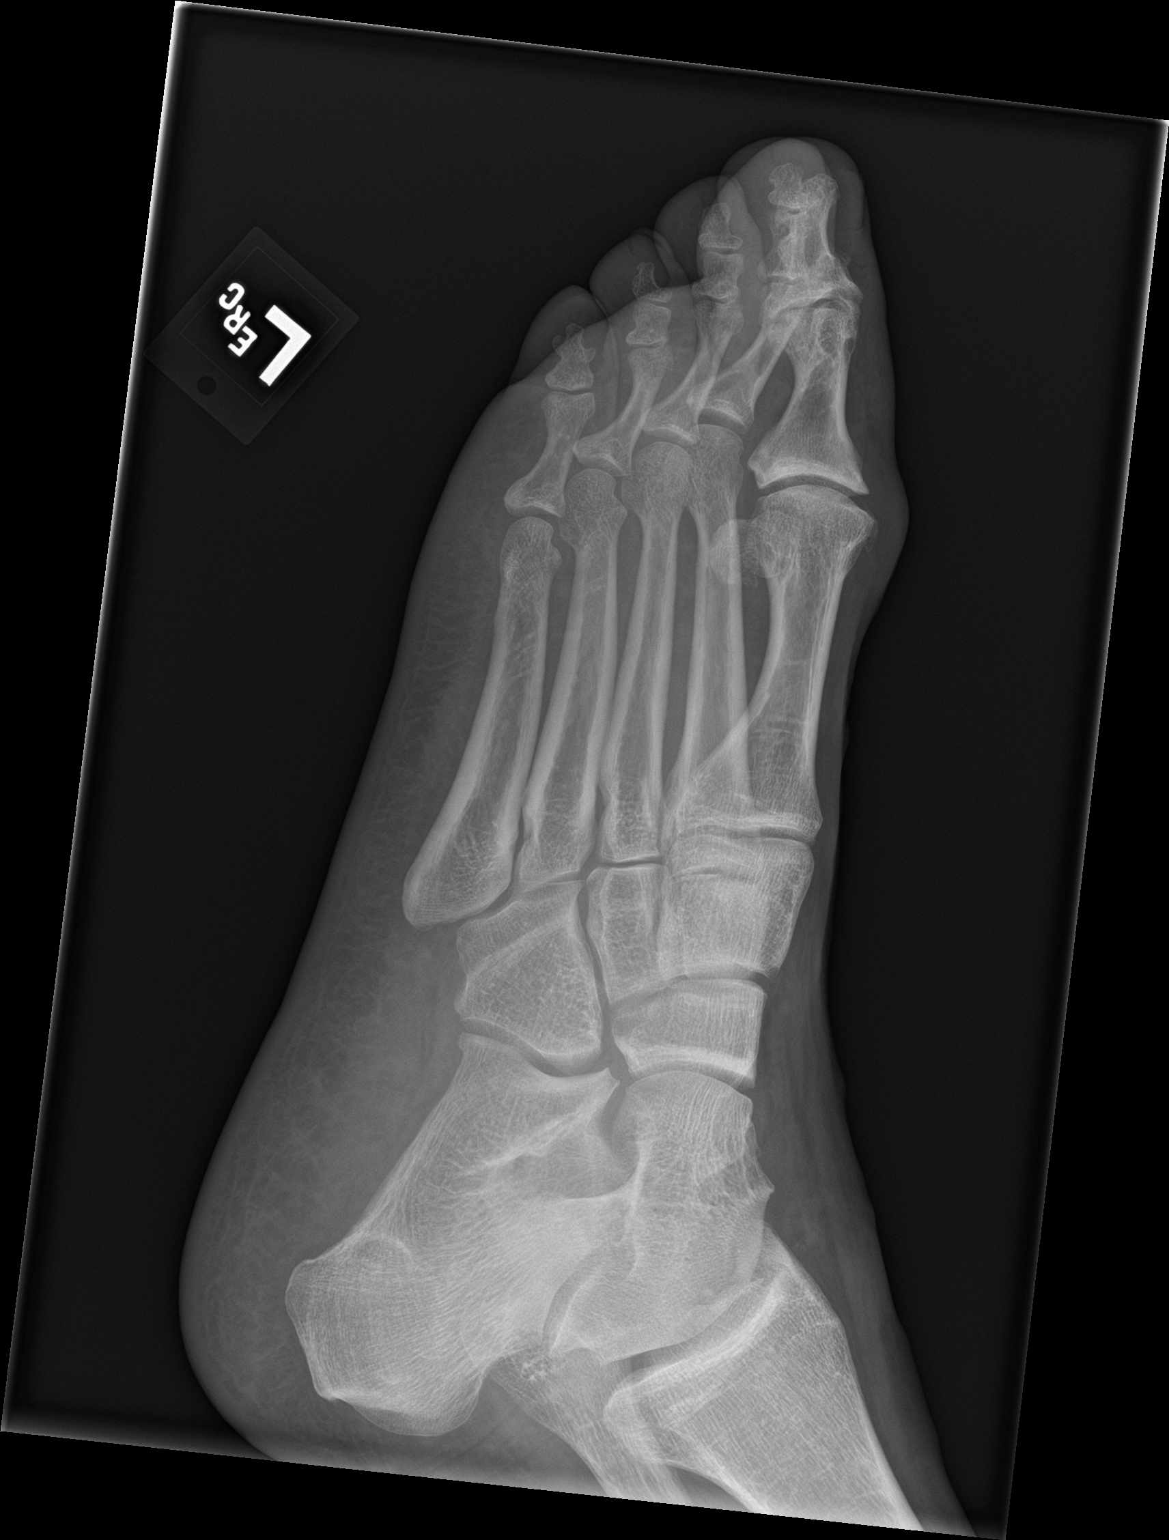
[im 3/3]
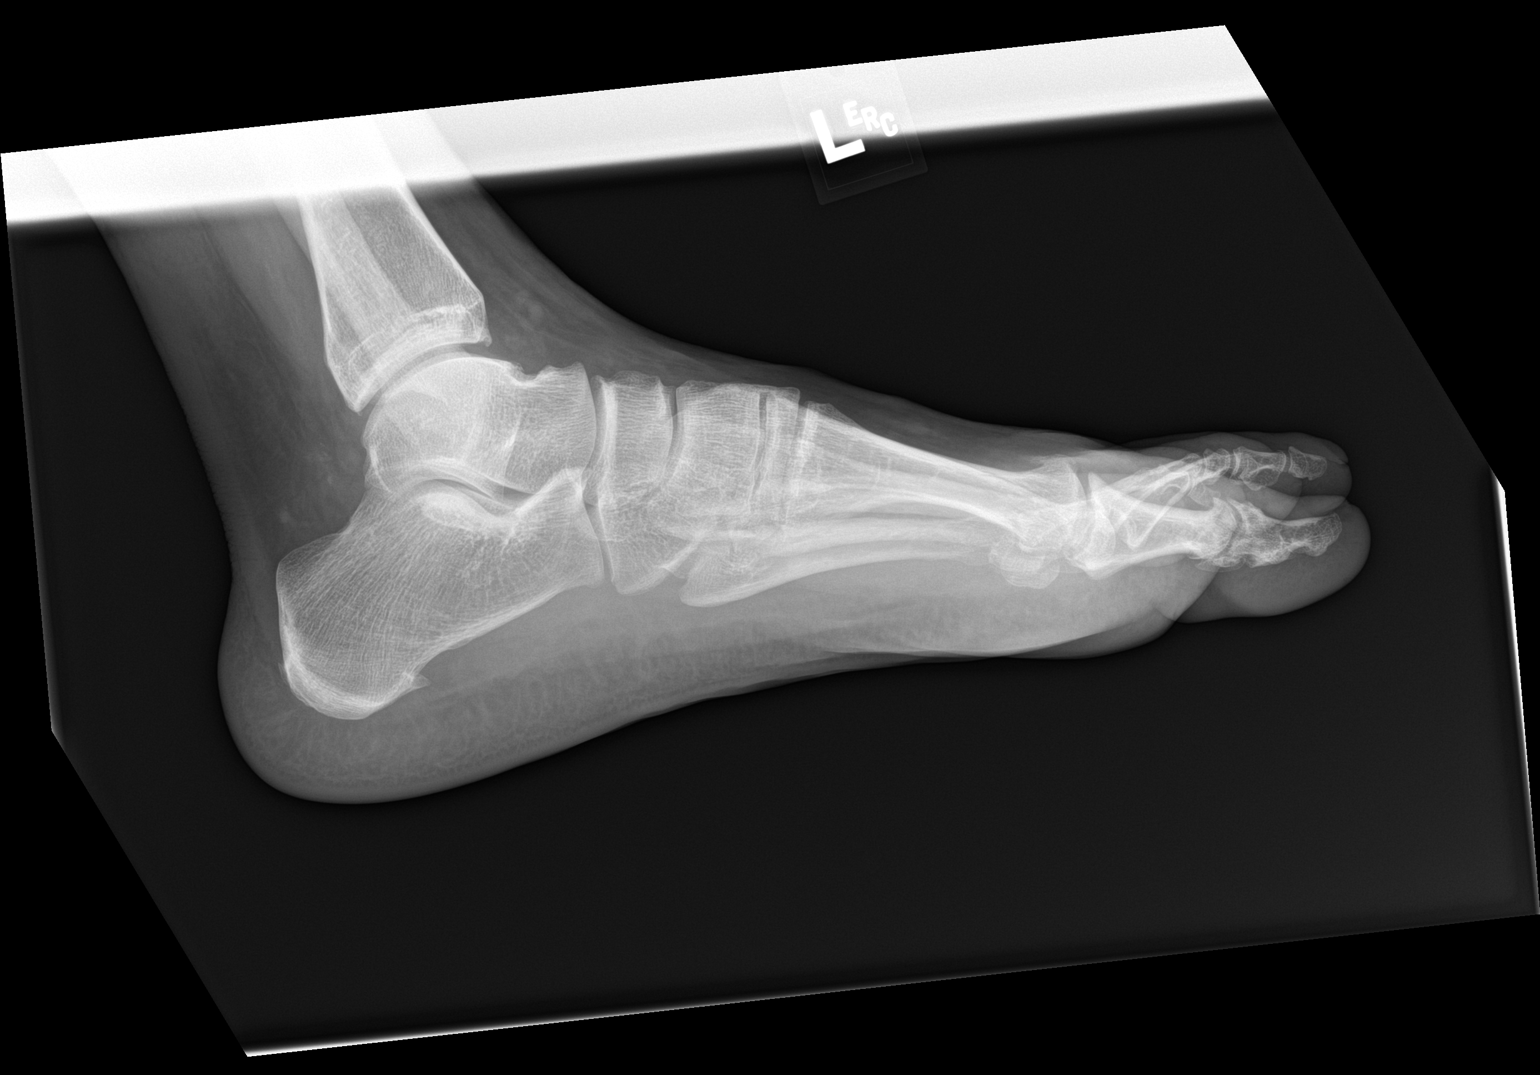

[3 of 3 positions shown; findings below may reference images not displayed]

FINDINGS: There is no evidence of an acute fracture or dislocation. Chronic
changes are seen involving the distal aspect of the proximal phalanx
of the left great toe and base of the distal phalanx. Soft tissues
are unremarkable.
IMPRESSION: No acute fracture or dislocation.

## 2024-10-02 ENCOUNTER — Ambulatory Visit: Admitting: Cardiology
